# Patient Record
Sex: Male | Born: 1975 | Race: White | Hispanic: No | Smoking: Former smoker
Health system: Southern US, Community
[De-identification: ages and names within clinical notes are randomized; demographics above are authoritative.]

## PROBLEM LIST (undated history)

## (undated) ENCOUNTER — Emergency Department (HOSPITAL_COMMUNITY): Payer: Self-pay | Source: Home / Self Care

## (undated) ENCOUNTER — Ambulatory Visit (HOSPITAL_COMMUNITY): Discharge status: Absent without leave | Payer: BC Managed Care – PPO

## (undated) DIAGNOSIS — I10 Essential (primary) hypertension: Secondary | ICD-10-CM

## (undated) HISTORY — PX: THROAT SURGERY: SHX803

## (undated) HISTORY — DX: Essential (primary) hypertension: I10

## (undated) HISTORY — PX: APPENDECTOMY: SHX54

---

## 1998-03-13 ENCOUNTER — Observation Stay (HOSPITAL_COMMUNITY): Admission: EM | Admit: 1998-03-13 | Discharge: 1998-03-14 | Payer: Self-pay | Admitting: Emergency Medicine

## 2015-03-16 DIAGNOSIS — K4020 Bilateral inguinal hernia, without obstruction or gangrene, not specified as recurrent: Secondary | ICD-10-CM | POA: Insufficient documentation

## 2015-03-16 DIAGNOSIS — Z6835 Body mass index (BMI) 35.0-35.9, adult: Secondary | ICD-10-CM | POA: Insufficient documentation

## 2015-03-16 DIAGNOSIS — R1032 Left lower quadrant pain: Secondary | ICD-10-CM | POA: Insufficient documentation

## 2015-04-13 DIAGNOSIS — Z09 Encounter for follow-up examination after completed treatment for conditions other than malignant neoplasm: Secondary | ICD-10-CM

## 2015-04-13 HISTORY — DX: Encounter for follow-up examination after completed treatment for conditions other than malignant neoplasm: Z09

## 2017-03-05 ENCOUNTER — Encounter (HOSPITAL_COMMUNITY): Payer: Self-pay | Admitting: Emergency Medicine

## 2017-03-05 ENCOUNTER — Emergency Department (HOSPITAL_COMMUNITY)
Admission: EM | Admit: 2017-03-05 | Discharge: 2017-03-05 | Disposition: A | Discharge status: Home / Self Care | Payer: Managed Care, Other (non HMO) | Attending: Physician Assistant | Admitting: Physician Assistant

## 2017-03-05 ENCOUNTER — Emergency Department (HOSPITAL_COMMUNITY): Payer: Managed Care, Other (non HMO)

## 2017-03-05 DIAGNOSIS — Z87891 Personal history of nicotine dependence: Secondary | ICD-10-CM | POA: Diagnosis not present

## 2017-03-05 DIAGNOSIS — Z79899 Other long term (current) drug therapy: Secondary | ICD-10-CM | POA: Insufficient documentation

## 2017-03-05 DIAGNOSIS — R42 Dizziness and giddiness: Secondary | ICD-10-CM

## 2017-03-05 LAB — DIFFERENTIAL
BASOS ABS: 0.1 10*3/uL (ref 0.0–0.1)
Basophils Relative: 1 %
Eosinophils Absolute: 0.1 10*3/uL (ref 0.0–0.7)
Eosinophils Relative: 1 %
LYMPHS ABS: 1.9 10*3/uL (ref 0.7–4.0)
LYMPHS PCT: 24 %
MONOS PCT: 7 %
Monocytes Absolute: 0.6 10*3/uL (ref 0.1–1.0)
NEUTROS PCT: 67 %
Neutro Abs: 5.6 10*3/uL (ref 1.7–7.7)

## 2017-03-05 LAB — APTT: APTT: 31 s (ref 24–36)

## 2017-03-05 LAB — I-STAT CHEM 8, ED
BUN: 7 mg/dL (ref 6–20)
CALCIUM ION: 1.13 mmol/L — AB (ref 1.15–1.40)
CHLORIDE: 103 mmol/L (ref 101–111)
Creatinine, Ser: 0.8 mg/dL (ref 0.61–1.24)
GLUCOSE: 114 mg/dL — AB (ref 65–99)
HCT: 51 % (ref 39.0–52.0)
Hemoglobin: 17.3 g/dL — ABNORMAL HIGH (ref 13.0–17.0)
POTASSIUM: 3.8 mmol/L (ref 3.5–5.1)
Sodium: 139 mmol/L (ref 135–145)
TCO2: 22 mmol/L (ref 22–32)

## 2017-03-05 LAB — COMPREHENSIVE METABOLIC PANEL
ALBUMIN: 4.5 g/dL (ref 3.5–5.0)
ALK PHOS: 72 U/L (ref 38–126)
ALT: 31 U/L (ref 17–63)
AST: 33 U/L (ref 15–41)
Anion gap: 14 (ref 5–15)
BILIRUBIN TOTAL: 1.1 mg/dL (ref 0.3–1.2)
BUN: 5 mg/dL — AB (ref 6–20)
CALCIUM: 9.6 mg/dL (ref 8.9–10.3)
CO2: 20 mmol/L — ABNORMAL LOW (ref 22–32)
CREATININE: 0.87 mg/dL (ref 0.61–1.24)
Chloride: 102 mmol/L (ref 101–111)
GFR calc Af Amer: >60 mL/min (ref >60)
GLUCOSE: 116 mg/dL — AB (ref 65–99)
POTASSIUM: 3.7 mmol/L (ref 3.5–5.1)
Sodium: 136 mmol/L (ref 135–145)
TOTAL PROTEIN: 8.4 g/dL — AB (ref 6.5–8.1)

## 2017-03-05 LAB — PROTIME-INR
INR: 0.98
Prothrombin Time: 12.9 seconds (ref 11.4–15.2)

## 2017-03-05 LAB — CBC
HEMATOCRIT: 49.3 % (ref 39.0–52.0)
Hemoglobin: 17.1 g/dL — ABNORMAL HIGH (ref 13.0–17.0)
MCH: 30.5 pg (ref 26.0–34.0)
MCHC: 34.7 g/dL (ref 30.0–36.0)
MCV: 87.9 fL (ref 78.0–100.0)
Platelets: 267 10*3/uL (ref 150–400)
RBC: 5.61 MIL/uL (ref 4.22–5.81)
RDW: 12.5 % (ref 11.5–15.5)
WBC: 8.2 10*3/uL (ref 4.0–10.5)

## 2017-03-05 LAB — I-STAT TROPONIN, ED: TROPONIN I, POC: 0 ng/mL (ref 0.00–0.08)

## 2017-03-05 LAB — CBG MONITORING, ED: Glucose-Capillary: 106 mg/dL — ABNORMAL HIGH (ref 65–99)

## 2017-03-05 MED ORDER — SODIUM CHLORIDE 0.9 % IV BOLUS (SEPSIS)
1000.0000 mL | Freq: Once | INTRAVENOUS | Status: AC
  Administered 2017-03-05: 1000 mL via INTRAVENOUS

## 2017-03-05 NOTE — ED Notes (Signed)
Checked CBG 106, RN informed 

## 2017-03-05 NOTE — ED Provider Notes (Signed)
[facility]   CSN: [id number] Arrival date & time: 03/05/17  1905     History   Chief Complaint Chief Complaint  Patient presents with  . Dizziness    HPI Ricardo Odom is a 42 y.o. male with no past medical history presenting with sudden onset lightheadedness at 830 last night while he was working.  Patient explains that he was squatting down and standing up quickly multiple times before this happened.  Denies any room spinning.  States that he felt lightheaded but it seemed to last longer than he would expect.  He went and got some rest sat down and improved.  Reports that he thinks he is getting plenty of hydration.  Never had an episode like this before.  It is not exacerbated by rapid head movement.  He reports feeling slightly lightheaded here and there since but not anything as severe as last night.  Denies any headache, visual disturbances, nausea, vomiting, loss of balance, difficulty walking, focal weakness or numbness.  Recent illness, no fever chills, chest pain, shortness of breath. Denies any history of DVT/PE, prolonged immobilization, recent surgery, cough, hemoptysis, unilateral leg swelling or calf pain.  HPI  History reviewed. No pertinent past medical history.  There are no active problems to display for this patient.   Past Surgical History:  Procedure Laterality Date  . APPENDECTOMY    . THROAT SURGERY         Home Medications    Prior to Admission medications   Medication Sig Start Date End Date Taking? Authorizing Provider  Multiple Vitamin (MULTIVITAMIN WITH MINERALS) TABS tablet Take 1 tablet by mouth daily.   Yes [provider]    Family History No family history on file.  Social History Social History   Tobacco Use  . Smoking status: Former Games developermoker  . Smokeless tobacco: Current User    Types: Snuff  Substance Use Topics  . Alcohol use: Yes    Comment: daily  . Drug use:  No     Allergies   Patient has no known allergies.   Review of Systems Review of Systems  Constitutional: Negative for chills, diaphoresis, fatigue and fever.  HENT: Negative for congestion, ear discharge, ear pain, hearing loss, sore throat, tinnitus, trouble swallowing and voice change.   Eyes: Negative for photophobia, pain, redness and visual disturbance.  Respiratory: Negative for cough, choking, chest tightness, shortness of breath, wheezing and stridor.   Cardiovascular: Negative for chest pain, palpitations and leg swelling.  Gastrointestinal: Negative for abdominal distention, abdominal pain, diarrhea, nausea and vomiting.  Genitourinary: Negative for difficulty urinating, dysuria, flank pain, frequency and hematuria.  Musculoskeletal: Negative for arthralgias, back pain, gait problem, joint swelling, myalgias, neck pain and neck stiffness.  Skin: Negative for color change, pallor, rash and wound.  Neurological: Positive for light-headedness. Negative for tremors, seizures, syncope, facial asymmetry, speech difficulty, weakness, numbness and headaches.     Physical Exam Updated Vital Signs BP (!) 165/107 (BP Location: Right Arm)   Pulse 92   Temp 97.9 F (36.6 C) (Oral)   Resp 20   Ht 5\' 6"  (1.676 m)   Wt 99.8 kg (220 lb)   SpO2 97%   BMI 35.51 kg/m   Physical Exam  Constitutional: He is oriented to person, place, and time. He appears well-developed and well-nourished. No distress.  Afebrile, nontoxic-appearing, sitting comfortably in bed no acute distress.  Reporting being asymptomatic.  HENT:  Head: Normocephalic and atraumatic.  Right Ear: External ear normal.  Left Ear: External ear normal.  Mouth/Throat: Oropharynx is clear and moist. No oropharyngeal exudate.  Eyes: Conjunctivae and EOM are normal. Pupils are equal, round, and reactive to light. Right eye exhibits no discharge. Left eye exhibits no discharge.  Neck: Normal range of motion. Neck supple.    Cardiovascular: Normal rate, regular rhythm, normal heart sounds and intact distal pulses.  No murmur heard. Pulmonary/Chest: Effort normal and breath sounds normal. No stridor. No respiratory distress. He has no wheezes. He has no rales.  Abdominal: He exhibits no distension.  Musculoskeletal: Normal range of motion. He exhibits no edema.  Neurological: He is alert and oriented to person, place, and time. No cranial nerve deficit or sensory deficit. He exhibits normal muscle tone. Coordination normal.  Neurologic Exam:  - Mental status: Patient is alert and cooperative. Fluent speech and words are clear. Coherent thought processes and insight is good. Patient is oriented x 4 to person, place, time and event.  - Cranial nerves:  CN III, IV, VI: pupils equally round, reactive to light both direct and conscensual. Full extra-ocular movement. CN V: motor temporalis and masseter strength intact. CN VII : muscles of facial expression intact. CN X :  midline uvula. XI strength of sternocleidomastoid and trapezius muscles 5/5, XII: tongue is midline when protruded. - Motor: No involuntary movements. Muscle tone and bulk normal throughout. Muscle strength is 5/5 in bilateral shoulder abduction, elbow flexion and extension, grip, hip extension, flexion, leg flexion and extension, ankle dorsiflexion and plantar flexion.  - Sensory: Proprioception, light tough sensation intact in all extremities.  - Cerebellar: rapid alternating movements and point to point movement intact in upper and lower extremities. Normal stance and gait.  Negative pronator drift, negative Romberg.    Skin: Skin is warm and dry. No rash noted. He is not diaphoretic. No erythema. No pallor.  Psychiatric: He has a normal mood and affect.  Nursing note and vitals reviewed.    ED Treatments / Results  Labs (all labs ordered are listed, but only abnormal results are displayed) Labs Reviewed  CBC - Abnormal; Notable for the following  components:      Result Value   Hemoglobin 17.1 (*)    All other components within normal limits  COMPREHENSIVE METABOLIC PANEL - Abnormal; Notable for the following components:   CO2 20 (*)    Glucose, Bld 116 (*)    BUN 5 (*)    Total Protein 8.4 (*)    All other components within normal limits  CBG MONITORING, ED - Abnormal; Notable for the following components:   Glucose-Capillary 106 (*)    All other components within normal limits  I-STAT CHEM 8, ED - Abnormal; Notable for the following components:   Glucose, Bld 114 (*)    Calcium, Ion 1.13 (*)    Hemoglobin 17.3 (*)    All other components within normal limits  PROTIME-INR  APTT  DIFFERENTIAL  I-STAT TROPONIN, ED    EKG  EKG Interpretation None       Radiology Ct Head Wo Contrast  Result Date: 03/05/2017 CLINICAL DATA:  Sudden onset of dizziness with nausea. EXAM: CT HEAD WITHOUT CONTRAST TECHNIQUE: Contiguous axial images were obtained from the base of the skull through the vertex without intravenous contrast. COMPARISON:  None. FINDINGS: Brain: No evidence of acute infarction, hemorrhage, hydrocephalus, extra-axial collection or mass lesion/mass effect. Vascular: No hyperdense vessel or unexpected calcification. Skull: Normal. Negative for fracture or focal lesion. Sinuses/Orbits:  Globes and orbits are unremarkable. Visualized sinuses and mastoid air cells are clear. Other: None. IMPRESSION: Normal unenhanced CT scan of the brain. Electronically Signed   By: Amie Portland M.D.   On: 03/05/2017 20:30    Procedures Procedures (including critical care time)  Medications Ordered in ED Medications  sodium chloride 0.9 % bolus 1,000 mL (0 mLs Intravenous Stopped 03/05/17 2228)     Initial Impression / Assessment and Plan / ED Course  I have reviewed the triage vital signs and the nursing notes.  Pertinent labs & imaging results that were available during my care of the patient were reviewed by me and considered in  my medical decision making (see chart for details).    Patient presenting with an episode of lightheadedness associated with rapid positional changes from squatting down to standing up last night.  He does report that it persisted for a few minutes longer than he would have expected but improved after he sat down. No h/a, vis dist, N/V.  He reports mild lightheadedness here and there but nothing as severe since.  Currently asymptomatic. Reassuring exam, normal neuro  Workup negative, negative CT head, labs unremarkable.  Ordered orthostatic vitals and IV fluids and will reassess.  On reassessment, Patient reported improvement and has not had any symptoms while in ED.  Will dc home with close PCP follow up. Discussed bp and advised repeat bp at PCP office.  Discussed strict return precautions and advised to return to the emergency department if experiencing any new or worsening symptoms. Instructions were understood and patient agreed with discharge plan.  Final Clinical Impressions(s) / ED Diagnoses   Final diagnoses:  Lightheadedness    ED Discharge Orders    None       Gregary Cromer 03/05/17 2335    Abelino Derrick, MD 03/09/17 [phone]

## 2017-03-05 NOTE — ED Notes (Signed)
Patient transported to CT 

## 2017-03-05 NOTE — Discharge Instructions (Signed)
As discussed, make sure that you continue stay well-hydrated drinking enough fluids to keep your urine clear.  Take your time when getting out of bed and sit at the edge of the bed before standing up.  Follow-up with your primary care provider in the next week. Return sooner if symptoms worsen, bad headache, vomiting, visual disturbances, dizziness, loss of balance, weakness or other new concerning symptoms in the meantime.

## 2017-03-05 NOTE — ED Triage Notes (Signed)
Reports sudden onset of dizziness at 1830 last night with nausea.  Reports resting a few minutes and it got better but still feels dizzy and is nauseated

## 2017-03-22 ENCOUNTER — Telehealth: Payer: Self-pay | Admitting: *Deleted

## 2017-03-22 NOTE — Telephone Encounter (Signed)
REFERRAL SENT TO SCHEDULING FROM [facility], DR. HOLT, [phone]

## 2017-05-05 ENCOUNTER — Encounter: Payer: Self-pay | Admitting: Internal Medicine

## 2017-05-11 ENCOUNTER — Encounter: Payer: Self-pay | Admitting: Internal Medicine

## 2017-05-11 ENCOUNTER — Ambulatory Visit: Payer: Managed Care, Other (non HMO) | Admitting: Internal Medicine

## 2017-05-11 VITALS — BP 150/100 | HR 88 | Ht 66.0 in | Wt 196.4 lb

## 2017-05-11 DIAGNOSIS — R42 Dizziness and giddiness: Secondary | ICD-10-CM

## 2017-05-11 DIAGNOSIS — R002 Palpitations: Secondary | ICD-10-CM

## 2017-05-11 DIAGNOSIS — I493 Ventricular premature depolarization: Secondary | ICD-10-CM

## 2017-05-11 DIAGNOSIS — I10 Essential (primary) hypertension: Secondary | ICD-10-CM | POA: Diagnosis not present

## 2017-05-11 LAB — LIPID PANEL
CHOLESTEROL TOTAL: 210 mg/dL — AB (ref 100–199)
Chol/HDL Ratio: 3.9 ratio (ref 0.0–5.0)
HDL: 54 mg/dL (ref >39)
LDL CALC: 130 mg/dL — AB (ref 0–99)
Triglycerides: 128 mg/dL (ref 0–149)
VLDL Cholesterol Cal: 26 mg/dL (ref 5–40)

## 2017-05-11 LAB — BASIC METABOLIC PANEL
BUN/Creatinine Ratio: 10 (ref 9–20)
BUN: 9 mg/dL (ref 6–24)
CALCIUM: 10.3 mg/dL — AB (ref 8.7–10.2)
CHLORIDE: 99 mmol/L (ref 96–106)
CO2: 23 mmol/L (ref 20–29)
Creatinine, Ser: 0.86 mg/dL (ref 0.76–1.27)
GFR calc non Af Amer: 107 mL/min/{1.73_m2} (ref >59)
GFR, EST AFRICAN AMERICAN: 124 mL/min/{1.73_m2} (ref >59)
Glucose: 93 mg/dL (ref 65–99)
POTASSIUM: 4.5 mmol/L (ref 3.5–5.2)
Sodium: 139 mmol/L (ref 134–144)

## 2017-05-11 LAB — TSH: TSH: 0.992 u[IU]/mL (ref 0.450–4.500)

## 2017-05-11 LAB — MAGNESIUM: Magnesium: 2.2 mg/dL (ref 1.6–2.3)

## 2017-05-11 NOTE — Patient Instructions (Addendum)
Medication Instructions:  Your physician recommends that you continue on your current medications as directed. Please refer to the Current Medication list given to you today.  -- If you need a refill on your cardiac medications before your next appointment, please call your pharmacy. --  Labwork: BPM/MAGNESIUM/TSH/LIPID PROFILE  TODAY   Testing/Procedures:ASAP  Your physician has requested that you have an echocardiogram. Echocardiography is a painless test that uses sound waves to create images of your heart. It provides your doctor with information about the size and shape of your heart and how well your heart's chambers and valves are working. This procedure takes approximately one hour. There are no restrictions for this procedure.   Follow-Up: Your physician wants you to follow-up in: 4-6 WEEKS with Dr. Okey DupreEnd or APP   Thank you for choosing [facility]!!    Any Other Special Instructions Will Be Listed Below (If Applicable).    DASH Eating Plan DASH stands for "Dietary Approaches to Stop Hypertension." The DASH eating plan is a healthy eating plan that has been shown to reduce high blood pressure (hypertension). It may also reduce your risk for type 2 diabetes, heart disease, and stroke. The DASH eating plan may also help with weight loss. What are tips for following this plan? General guidelines  Avoid eating more than 2,300 mg (milligrams) of salt (sodium) a day. If you have hypertension, you may need to reduce your sodium intake to 1,500 mg a day.  Limit alcohol intake to no more than 1 drink a day for nonpregnant women and 2 drinks a day for men. One drink equals 12 oz of beer, 5 oz of wine, or 1 oz of hard liquor.  Work with your health care provider to maintain a healthy body weight or to lose weight. Ask what an ideal weight is for you.  Get at least 30 minutes of exercise that causes your heart to beat faster (aerobic exercise) most days of the week. Activities may  include walking, swimming, or biking.  Work with your health care provider or diet and nutrition specialist (dietitian) to adjust your eating plan to your individual calorie needs. Reading food labels  Check food labels for the amount of sodium per serving. Choose foods with less than 5 percent of the Daily Value of sodium. Generally, foods with less than 300 mg of sodium per serving fit into this eating plan.  To find whole grains, look for the word "whole" as the first word in the ingredient list. Shopping  Buy products labeled as "low-sodium" or "no salt added."  Buy fresh foods. Avoid canned foods and premade or frozen meals. Cooking  Avoid adding salt when cooking. Use salt-free seasonings or herbs instead of table salt or sea salt. Check with your health care provider or pharmacist before using salt substitutes.  Do not fry foods. Cook foods using healthy methods such as baking, boiling, grilling, and broiling instead.  Cook with heart-healthy oils, such as olive, canola, soybean, or sunflower oil. Meal planning   Eat a balanced diet that includes: ? 5 or more servings of fruits and vegetables each day. At each meal, try to fill half of your plate with fruits and vegetables. ? Up to 6-8 servings of whole grains each day. ? Less than 6 oz of lean meat, poultry, or fish each day. A 3-oz serving of meat is about the same size as a deck of cards. One egg equals 1 oz. ? 2 servings of low-fat dairy each day. ?  A serving of nuts, seeds, or beans 5 times each week. ? Heart-healthy fats. Healthy fats called Omega-3 fatty acids are found in foods such as flaxseeds and coldwater fish, like sardines, salmon, and mackerel.  Limit how much you eat of the following: ? Canned or prepackaged foods. ? Food that is high in trans fat, such as fried foods. ? Food that is high in saturated fat, such as fatty meat. ? Sweets, desserts, sugary drinks, and other foods with added sugar. ? Full-fat  dairy products.  Do not salt foods before eating.  Try to eat at least 2 vegetarian meals each week.  Eat more home-cooked food and less restaurant, buffet, and fast food.  When eating at a restaurant, ask that your food be prepared with less salt or no salt, if possible. What foods are recommended? The items listed may not be a complete list. Talk with your dietitian about what dietary choices are best for you. Grains Whole-grain or whole-wheat bread. Whole-grain or whole-wheat pasta. Brown rice. Orpah Cobb. Bulgur. Whole-grain and low-sodium cereals. Pita bread. Low-fat, low-sodium crackers. Whole-wheat flour tortillas. Vegetables Fresh or frozen vegetables (raw, steamed, roasted, or grilled). Low-sodium or reduced-sodium tomato and vegetable juice. Low-sodium or reduced-sodium tomato sauce and tomato paste. Low-sodium or reduced-sodium canned vegetables. Fruits All fresh, dried, or frozen fruit. Canned fruit in natural juice (without added sugar). Meat and other protein foods Skinless chicken or [place]. Ground chicken or [place]. Pork with fat trimmed off. Fish and seafood. Egg whites. Dried beans, peas, or lentils. Unsalted nuts, nut butters, and seeds. Unsalted canned beans. Lean cuts of beef with fat trimmed off. Low-sodium, lean deli meat. Dairy Low-fat (1%) or fat-free (skim) milk. Fat-free, low-fat, or reduced-fat cheeses. Nonfat, low-sodium ricotta or cottage cheese. Low-fat or nonfat yogurt. Low-fat, low-sodium cheese. Fats and oils Soft margarine without trans fats. Vegetable oil. Low-fat, reduced-fat, or light mayonnaise and salad dressings (reduced-sodium). Canola, safflower, olive, soybean, and sunflower oils. Avocado. Seasoning and other foods Herbs. Spices. Seasoning mixes without salt. Unsalted popcorn and pretzels. Fat-free sweets. What foods are not recommended? The items listed may not be a complete list. Talk with your dietitian about what dietary choices are best  for you. Grains Baked goods made with fat, such as croissants, muffins, or some breads. Dry pasta or rice meal packs. Vegetables Creamed or fried vegetables. Vegetables in a cheese sauce. Regular canned vegetables (not low-sodium or reduced-sodium). Regular canned tomato sauce and paste (not low-sodium or reduced-sodium). Regular tomato and vegetable juice (not low-sodium or reduced-sodium). Rosita Fire. Olives. Fruits Canned fruit in a light or heavy syrup. Fried fruit. Fruit in cream or butter sauce. Meat and other protein foods Fatty cuts of meat. Ribs. Fried meat. Tomasa Blase. Sausage. Bologna and other processed lunch meats. Salami. Fatback. Hotdogs. Bratwurst. Salted nuts and seeds. Canned beans with added salt. Canned or smoked fish. Whole eggs or egg yolks. Chicken or [place] with skin. Dairy Whole or 2% milk, cream, and half-and-half. Whole or full-fat cream cheese. Whole-fat or sweetened yogurt. Full-fat cheese. Nondairy creamers. Whipped toppings. Processed cheese and cheese spreads. Fats and oils Butter. Stick margarine. Lard. Shortening. Ghee. Bacon fat. Tropical oils, such as coconut, palm kernel, or palm oil. Seasoning and other foods Salted popcorn and pretzels. Onion salt, garlic salt, seasoned salt, table salt, and sea salt. Worcestershire sauce. Tartar sauce. Barbecue sauce. Teriyaki sauce. Soy sauce, including reduced-sodium. Steak sauce. Canned and packaged gravies. Fish sauce. Oyster sauce. Cocktail sauce. Horseradish that you find on the shelf. Ketchup.  Mustard. Meat flavorings and tenderizers. Bouillon cubes. Hot sauce and Tabasco sauce. Premade or packaged marinades. Premade or packaged taco seasonings. Relishes. Regular salad dressings. Where to find more information:  National Heart, Lung, and Blood Institute: PopSteam.is  American Heart Association: www.heart.org Summary  The DASH eating plan is a healthy eating plan that has been shown to reduce high blood pressure  (hypertension). It may also reduce your risk for type 2 diabetes, heart disease, and stroke.  With the DASH eating plan, you should limit salt (sodium) intake to 2,300 mg a day. If you have hypertension, you may need to reduce your sodium intake to 1,500 mg a day.  When on the DASH eating plan, aim to eat more fresh fruits and vegetables, whole grains, lean proteins, low-fat dairy, and heart-healthy fats.  Work with your health care provider or diet and nutrition specialist (dietitian) to adjust your eating plan to your individual calorie needs. This information is not intended to replace advice given to you by your health care provider. Make sure you discuss any questions you have with your health care provider. Document Released: 12/30/2010 Document Revised: 01/04/2016 Document Reviewed: 01/04/2016 Elsevier Interactive Patient Education  [employer].

## 2017-05-11 NOTE — Progress Notes (Signed)
New Outpatient Visit Date: 05/11/2017  Referring Provider: Marylen Ponto, MD [address] [place] [address],  Chief Complaint: Palpitations  HPI:  Ricardo Odom is a 42 y.o. male who is being seen today for the evaluation of palpitations at the request of Dr. Leonor Liv. He has no significant past medical history.  He presented to the [facility] emergency department in February with lightheadedness.  Workup was unrevealing with diagnosis of presumed orthostatic lightheadedness.  Today, Ricardo Odom reports feeling well.  He reports that 5-6 years ago, he had episodes during which it felt as though his heart was "pausing."  He saw Dr. Bing Matter in [place] and underwent a stress test and Holter monitor.  It sounds like PVCs may have been noted but otherwise no significant abnormalities.  Up until 2 months ago, Ricardo Odom had felt well.  He was at work and stood up with the sudden onset of lightheadedness that lasted about 30 seconds.  He continued to feel generally weak prompting him to go to the emergency department as noted above.  He denies chest pain, shortness of breath, edema, orthopnea, PND, and claudication.  Ricardo Odom subsequently followed with his PCP, at which time his blood pressure was found to be elevated.  Antihypertensive therapy was discussed, but Ricardo Odom wish to pursue lifestyle modifications first.  He has lost about 20 pounds over the last month and overall feels more energized.  He is also trying to limit his salt intake.  --------------------------------------------------------------------------------------------------  Cardiovascular History & Procedures: Cardiovascular Problems:  Palpitations  Lightheadedness  Risk Factors:  Hypertension and male gender  Cath/PCI:  None  CV Surgery:  None  EP Procedures and Devices:  None available  Non-Invasive Evaluation(s):  None available  Recent CV Pertinent Labs: Lab Results  Component Value Date   CHOL 210  (H) 05/11/2017   HDL 54 05/11/2017   LDLCALC 130 (H) 05/11/2017   TRIG 128 05/11/2017   CHOLHDL 3.9 05/11/2017   INR 0.98 03/05/2017   K 4.5 05/11/2017   MG 2.2 05/11/2017   BUN 9 05/11/2017   CREATININE 0.86 05/11/2017    --------------------------------------------------------------------------------------------------  Past Medical History:  Diagnosis Date  . Hypertension    Past Surgical History:  Procedure Laterality Date  . APPENDECTOMY    . THROAT SURGERY      Current Meds  Medication Sig  . Multiple Vitamin (MULTIVITAMIN WITH MINERALS) TABS tablet Take 1 tablet by mouth daily.    Allergies: Patient has no known allergies.  Social History   Socioeconomic History  . Marital status: Divorced    Spouse name: Not on file  . Number of children: Not on file  . Years of education: Not on file  . Highest education level: Not on file  Occupational History  . Not on file  Social Needs  . Financial resource strain: Not on file  . Food insecurity:    Worry: Not on file    Inability: Not on file  . Transportation needs:    Medical: Not on file    Non-medical: Not on file  Tobacco Use  . Smoking status: Former Smoker    Packs/day: 10.00    Years: 1.00    Pack years: 10.00    Types: Cigarettes    Last attempt to quit: 07/28/2002    Years since quitting: 14.8  . Smokeless tobacco: Current User    Types: Snuff  Substance and Sexual Activity  . Alcohol use: Yes    Alcohol/week: 15.0  oz    Types: 25 Cans of beer per week    Comment: daily  . Drug use: No  . Sexual activity: Not on file  Lifestyle  . Physical activity:    Days per week: Not on file    Minutes per session: Not on file  . Stress: Not on file  Relationships  . Social connections:    Talks on phone: Not on file    Gets together: Not on file    Attends religious service: Not on file    Active member of club or organization: Not on file    Attends meetings of clubs or organizations: Not on file      Relationship status: Not on file  . Intimate partner violence:    Fear of current or ex partner: Not on file    Emotionally abused: Not on file    Physically abused: Not on file    Forced sexual activity: Not on file  Other Topics Concern  . Not on file  Social History Narrative  . Not on file    Family History  Problem Relation Age of Onset  . Healthy Mother   . Other Father        pacemaker  . Bradycardia Father   . Healthy Sister   . Heart disease Paternal Grandfather     Review of Systems: A 12-system review of systems was performed and was negative except as noted in the HPI.  --------------------------------------------------------------------------------------------------  Physical Exam: BP (!) 150/100   Pulse 88   Ht  (1.676 m)   Wt 196 lb 6.4 oz (89.1 kg)   BMI 31.70 kg/m   General:  NAD. HEENT: No conjunctival pallor or scleral icterus. Moist mucous membranes. OP clear. Neck: Supple without lymphadenopathy, thyromegaly, JVD, or HJR. No carotid bruit. Lungs: Normal work of breathing. Clear to auscultation bilaterally without wheezes or crackles. Heart: Regular rate and rhythm without murmurs, rubs, or gallops. Non-displaced PMI. Abd: Bowel sounds present. Soft, NT/ND without hepatosplenomegaly Ext: No lower extremity edema. Radial, PT, and DP pulses are 2+ bilaterally Skin: Warm and dry without rash. Neuro: CNIII-XII intact. Strength and fine-touch sensation intact in upper and lower extremities bilaterally. Psych: Normal mood and affect.  EKG: Sinus bradycardia with isolated PVC and borderline LVH.  Lab Results  Component Value Date   WBC 8.2 03/05/2017   HGB 17.3 (H) 03/05/2017   HCT 51.0 03/05/2017   MCV 87.9 03/05/2017   PLT 267 03/05/2017    Lab Results  Component Value Date   NA 139 05/11/2017   K 4.5 05/11/2017   CL 99 05/11/2017   CO2 23 05/11/2017   BUN 9 05/11/2017   CREATININE 0.86 05/11/2017   GLUCOSE 93 05/11/2017   ALT  31 03/05/2017    Lab Results  Component Value Date   CHOL 210 (H) 05/11/2017   HDL 54 05/11/2017   LDLCALC 130 (H) 05/11/2017   TRIG 128 05/11/2017   CHOLHDL 3.9 05/11/2017     --------------------------------------------------------------------------------------------------  ASSESSMENT AND PLAN: Lightheadedness, palpitations, and PVC's Single episode occurred ~2 months ago followed by several weeks of generalized unwell feeling.  Ricardo Odom reports feeling back to baseline today.  Exam is unrevealing.  EKG shows borderline LVH with isolated PVC.  We have agreed to obtain a transthoracic echocardiogram for further assessment.  I will check a BMP, magnesium level, and TSH today.  We will also refer Ricardo Odom for an echocardiogram.  Given only a single episode has  occurred in the last 5-6 years, we have agreed to defer ambulatory cardiac monitoring.  I will check a lipid panel for risk stratification.  Hypertension BP elevated today.  Ricardo Odom would like to avoid medications, if possible.  I have encouraged him to minimize his salt intake and to continue to exercise and diet.  Follow-up: Return to clinic in 4-6 weeks.  Yvonne Kendall, MD 05/12/2017 1:42 PM

## 2017-05-12 ENCOUNTER — Encounter: Payer: Self-pay | Admitting: Internal Medicine

## 2017-05-12 DIAGNOSIS — I493 Ventricular premature depolarization: Secondary | ICD-10-CM | POA: Insufficient documentation

## 2017-05-12 DIAGNOSIS — R002 Palpitations: Secondary | ICD-10-CM

## 2017-05-12 DIAGNOSIS — R42 Dizziness and giddiness: Secondary | ICD-10-CM

## 2017-05-12 DIAGNOSIS — I10 Essential (primary) hypertension: Secondary | ICD-10-CM | POA: Insufficient documentation

## 2017-05-12 HISTORY — DX: Dizziness and giddiness: R42

## 2017-05-12 HISTORY — DX: Ventricular premature depolarization: I49.3

## 2017-05-12 HISTORY — DX: Essential (primary) hypertension: I10

## 2017-05-12 HISTORY — DX: Palpitations: R00.2

## 2017-05-16 ENCOUNTER — Ambulatory Visit (HOSPITAL_COMMUNITY): Payer: Managed Care, Other (non HMO)

## 2017-05-25 ENCOUNTER — Ambulatory Visit (HOSPITAL_COMMUNITY): Payer: Managed Care, Other (non HMO) | Attending: Cardiology

## 2017-05-25 ENCOUNTER — Other Ambulatory Visit: Payer: Self-pay

## 2017-05-25 DIAGNOSIS — R42 Dizziness and giddiness: Secondary | ICD-10-CM | POA: Insufficient documentation

## 2017-05-25 DIAGNOSIS — R002 Palpitations: Secondary | ICD-10-CM | POA: Diagnosis not present

## 2017-05-25 DIAGNOSIS — I10 Essential (primary) hypertension: Secondary | ICD-10-CM | POA: Insufficient documentation

## 2017-05-25 DIAGNOSIS — I493 Ventricular premature depolarization: Secondary | ICD-10-CM | POA: Diagnosis not present

## 2017-06-11 DIAGNOSIS — I77810 Thoracic aortic ectasia: Secondary | ICD-10-CM | POA: Insufficient documentation

## 2017-06-11 DIAGNOSIS — E785 Hyperlipidemia, unspecified: Secondary | ICD-10-CM | POA: Insufficient documentation

## 2017-06-11 HISTORY — DX: Thoracic aortic ectasia: I77.810

## 2017-06-11 HISTORY — DX: Hyperlipidemia, unspecified: E78.5

## 2017-06-11 NOTE — Progress Notes (Signed)
Cardiology Office Note:    Date:  06/12/2017   ID:  Ricardo Odom, DOB 12/19/1975, MRN [id number]  PCP:  Marylen Ponto, MD  Cardiologist:  Yvonne Kendall, MD  Referring MD: Marylen Ponto, MD   Chief Complaint  Patient presents with  . Follow-up    echocardiogram, hypertension    History of Present Illness:    Ricardo Odom is a 42 y.o. male with a past medical history significant for hypertension. He was seen for initial evaluation of lightheadedness on 05/11/17 by Dr. Okey Dupre. He had presented to [facility] ED in February with lightheadedness that occurred at work upon standing. Workup in the ED only pointed to orthostatic lightheadedness.   [name] was feeling well at his appointment in April. Upon review of his prior health he had noted some pausing of his heart beat about 5-6 years ago. This was evaluated by Dr. Bing Matter with a stress test and holter monitor. Dr. Okey Dupre felt this was likely to have been PVCs. Since the patient has not noted palpitations except for that singe episode, Outpatient monitor was deferred. Labs were essentially normal, very slightly elevated calcium.  An echocardiogram was done and revealed normal LV systolic function with EF 55-60%, grade 1 DD, mildly dilated aortic root and mild aortic regurgitation.   Pt is here today for follow up with his wife. He has had no further lightheadedness. 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    Since the episode in February he has changed his diet, he gave up sodas, most sugar, salt, bread and pasta. He is drinking smoothies, fruits and vegetables, kale. He has lost 20 lbs in last 3 months. He quit smoking on 07/28/2002. He is having nop chest discomfort or shortness of breath. He has just worked 3rd shift and attributes his elevated BP in our office to being tired. I rechecked his BP after rest and it is still elevated. He  reports that home BP's have been 120's-130's/70's-80's. At 0630 this morning at home his BP was 124/84.    Past Medical History:  Diagnosis Date  . Hypertension     Past Surgical History:  Procedure Laterality Date  . APPENDECTOMY    . THROAT SURGERY      Current Medications: Current Meds  Medication Sig  . Multiple Vitamin (MULTIVITAMIN WITH MINERALS) TABS tablet Take 1 tablet by mouth daily.     Allergies:   Patient has no known allergies.   Social History   Socioeconomic History  . Marital status: Divorced    Spouse name: Not on file  . Number of children: Not on file  . Years of education: Not on file  . Highest education level: Not on file  Occupational History  . Not on file  Social Needs  . Financial resource strain: Not on file  . Food insecurity:    Worry: Not on file    Inability: Not on file  . Transportation needs:    Medical: Not on file    Non-medical: Not on file  Tobacco Use  . Smoking status: Former Smoker    Packs/day: 10.00    Years: 1.00    Pack years: 10.00    Types: Cigarettes    Last attempt to quit: 07/28/2002    Years since quitting: 14.8  . Smokeless tobacco: Current User    Types: Snuff  Substance and Sexual  Activity  . Alcohol use: Yes    Alcohol/week: 15.0 oz    Types: 25 Cans of beer per week    Comment: daily  . Drug use: No  . Sexual activity: Not on file  Lifestyle  . Physical activity:    Days per week: Not on file    Minutes per session: Not on file  . Stress: Not on file  Relationships  . Social connections:    Talks on phone: Not on file    Gets together: Not on file    Attends religious service: Not on file    Active member of club or organization: Not on file    Attends meetings of clubs or organizations: Not on file    Relationship status: Not on file  Other Topics Concern  . Not on file  Social History Narrative  . Not on file     Family History: The patient's family history includes Bradycardia in his  father; Healthy in his mother and sister; Heart disease in his paternal grandfather; Other in his father. ROS:   Please see the history of present illness.     All other systems reviewed and are negative.  EKGs/Labs/Other Studies Reviewed:    The following studies were reviewed today:  Echocardiogram 05/25/2017 Study Conclusions - Left ventricle: The cavity size was normal. Wall thickness was   normal. Systolic function was normal. The estimated ejection   fraction was in the range of 55% to 60%. Wall motion was normal;   there were no regional wall motion abnormalities. Doppler   parameters are consistent with abnormal left ventricular   relaxation (grade 1 diastolic dysfunction). - Aortic valve: There was mild regurgitation. - Aortic root: The aortic root was mildly dilated.  Impressions: - Normal LV systolic function; mild diastolic dysfunction;   sclerotic aortic valve with mild AI; mildly dilated aortic root   (4.3 cm).   EKG:  EKG is not ordered today.   Recent Labs: 03/05/2017: ALT 31; Hemoglobin 17.3; Platelets 267 05/11/2017: BUN 9; Creatinine, Ser 0.86; Magnesium 2.2; Potassium 4.5; Sodium 139; TSH 0.992   Recent Lipid Panel    Component Value Date/Time   CHOL 210 (H) 05/11/2017 0935   TRIG 128 05/11/2017 0935   HDL 54 05/11/2017 0935   CHOLHDL 3.9 05/11/2017 0935   LDLCALC 130 (H) 05/11/2017 0935    Physical Exam:    VS:  BP (!) 144/92   Pulse 77   Ht  (1.676 m)   Wt 189 lb (85.7 kg)   BMI 30.51 kg/m     Wt Readings from Last 3 Encounters:  06/12/17 189 lb (85.7 kg)  05/11/17 196 lb 6.4 oz (89.1 kg)  03/05/17 220 lb (99.8 kg)     Physical Exam  Constitutional: He is oriented to person, place, and time. He appears well-developed and well-nourished.  HENT:  Head: Normocephalic and atraumatic.  Neck: Normal range of motion. Neck supple. No JVD present.  Cardiovascular: Normal rate, regular rhythm, normal heart sounds and intact distal pulses.  Exam reveals no gallop and no friction rub.  No murmur heard. Pulmonary/Chest: Effort normal and breath sounds normal. No respiratory distress.  Abdominal: Soft. Bowel sounds are normal.  Musculoskeletal: Normal range of motion. He exhibits no edema or deformity.  Neurological: He is alert and oriented to person, place, and time.  Skin: Skin is warm and dry.  Psychiatric: He has a normal mood and affect. His behavior is normal. Thought content normal.  Vitals  reviewed.   ASSESSMENT:    1. Lightheadedness   2. Aortic root dilatation (HCC)   3. Essential hypertension   4. Mixed hyperlipidemia    PLAN:    In order of problems listed above:   Lightheadedness, palpitations, and PVCs: Lightheadedness in February, hx of palpitations 5-6 years ago. Labs unremarkable for cause of lightheadedness. Echo with normal LV systolic function. No further lightheadedness since February and no palpitations.   Aortic dilatation: 4.3 cm with mild aortic regurgitation. Trileaflet aortic valve. Pt denies known family history of sudden unexplained death, aortic aneurysm or aortic dissection. Advise CVD risk reduction, no smoking, BP control, cholesterol management. Will obtain CT to better evaluate aortic root. Pt wants to "shop around" for the best price. Plan to follow up in 6 months and If stable, monitor annually.   Hypertension: In setting of mild aortic dilatation, recommend BP control with SBP 105-120 mmHg. BP 144/92. Pt reports home BP's 120's-130's/70's-80's. Pt is reluctant to start medications, but agrees to low dose ARB. Will start losartan 50 mg. Pt advised on BP goal and to monitor BP at home. He will call if consistently >130/80 at which time would increase losartan to 100 mg. He has an appointment with his PCP in 3-4 weeks and will have BP checked at that time.   Hyperlipidemia: LDL 130. There has been some evidence of control of the growth of aortic aneurysms with the use of statin. Discussed  lipid management with patient. He has changed his eating habits and has lost over 20 lbs so far. He wants to forgo initiating a statin at present and continue to work on lifestyle modifications.    Medication Adjustments/Labs and Tests Ordered: Current medicines are reviewed at length with the patient today.  Concerns regarding medicines are outlined above. Labs and tests ordered and medication changes are outlined in the patient instructions below:  Patient Instructions  Medication Instructions:  1. START LOSARTAN 50 MG DAILY; RX HAS BEEN SENT IN   Labwork: NONE ORDERED TODAY  Testing/Procedures: YOU WILL NEED TO SCHEDULE A CHEST CT-A, CALL OUR OFFICE WHEN YOU ARE READY TO SCHEDULE TESTING [phone]  Follow-Up: Your physician wants you to follow-up in: 6 MONTHS WITH DR. END  You will receive a reminder letter in the mail two months in advance. If you don't receive a letter, please call our office to schedule the follow-up appointment.   Any Other Special Instructions Will Be Listed Below (If Applicable).  MONITOR BLOOD PRESSURE AND CALL THE OFFICE IS CONSISTENTLY RUNNING 130/80 OR HIGHER, IF RUNNING HIGHER WE MAY NEED TO INCREASE LOSARTAN; PER NP SHE WOULD LIKE TO SEE SYSTOLIC BLOOD PRESSURE (TOP NUMBER) BETWEEN 105-120  If you need a refill on your cardiac medications before your next appointment, please call your pharmacy.   DASH Eating Plan DASH stands for "Dietary Approaches to Stop Hypertension." The DASH eating plan is a healthy eating plan that has been shown to reduce high blood pressure (hypertension). It may also reduce your risk for type 2 diabetes, heart disease, and stroke. The DASH eating plan may also help with weight loss. What are tips for following this plan? General guidelines  Avoid eating more than 2,300 mg (milligrams) of salt (sodium) a day. If you have hypertension, you may need to reduce your sodium intake to 1,500 mg a day.  Limit alcohol intake to no  more than 1 drink a day for nonpregnant women and 2 drinks a day for men. One drink equals 12 oz  of beer, 5 oz of wine, or 1 oz of hard liquor.  Work with your health care provider to maintain a healthy body weight or to lose weight. Ask what an ideal weight is for you.  Get at least 30 minutes of exercise that causes your heart to beat faster (aerobic exercise) most days of the week. Activities may include walking, swimming, or biking.  Work with your health care provider or diet and nutrition specialist (dietitian) to adjust your eating plan to your individual calorie needs. Reading food labels  Check food labels for the amount of sodium per serving. Choose foods with less than 5 percent of the Daily Value of sodium. Generally, foods with less than 300 mg of sodium per serving fit into this eating plan.  To find whole grains, look for the word "whole" as the first word in the ingredient list. Shopping  Buy products labeled as "low-sodium" or "no salt added."  Buy fresh foods. Avoid canned foods and premade or frozen meals. Cooking  Avoid adding salt when cooking. Use salt-free seasonings or herbs instead of table salt or sea salt. Check with your health care provider or pharmacist before using salt substitutes.  Do not fry foods. Cook foods using healthy methods such as baking, boiling, grilling, and broiling instead.  Cook with heart-healthy oils, such as olive, canola, soybean, or sunflower oil. Meal planning   Eat a balanced diet that includes: ? 5 or more servings of fruits and vegetables each day. At each meal, try to fill half of your plate with fruits and vegetables. ? Up to 6-8 servings of whole grains each day. ? Less than 6 oz of lean meat, poultry, or fish each day. A 3-oz serving of meat is about the same size as a deck of cards. One egg equals 1 oz. ? 2 servings of low-fat dairy each day. ? A serving of nuts, seeds, or beans 5 times each week. ? Heart-healthy fats.  Healthy fats called Omega-3 fatty acids are found in foods such as flaxseeds and coldwater fish, like sardines, salmon, and mackerel.  Limit how much you eat of the following: ? Canned or prepackaged foods. ? Food that is high in trans fat, such as fried foods. ? Food that is high in saturated fat, such as fatty meat. ? Sweets, desserts, sugary drinks, and other foods with added sugar. ? Full-fat dairy products.  Do not salt foods before eating.  Try to eat at least 2 vegetarian meals each week.  Eat more home-cooked food and less restaurant, buffet, and fast food.  When eating at a restaurant, ask that your food be prepared with less salt or no salt, if possible. What foods are recommended? The items listed may not be a complete list. Talk with your dietitian about what dietary choices are best for you. Grains Whole-grain or whole-wheat bread. Whole-grain or whole-wheat pasta. Brown rice. Orpah Cobb. Bulgur. Whole-grain and low-sodium cereals. Pita bread. Low-fat, low-sodium crackers. Whole-wheat flour tortillas. Vegetables Fresh or frozen vegetables (raw, steamed, roasted, or grilled). Low-sodium or reduced-sodium tomato and vegetable juice. Low-sodium or reduced-sodium tomato sauce and tomato paste. Low-sodium or reduced-sodium canned vegetables. Fruits All fresh, dried, or frozen fruit. Canned fruit in natural juice (without added sugar). Meat and other protein foods Skinless chicken or [place]. Ground chicken or [place]. Pork with fat trimmed off. Fish and seafood. Egg whites. Dried beans, peas, or lentils. Unsalted nuts, nut butters, and seeds. Unsalted canned beans. Lean cuts of beef with fat  trimmed off. Low-sodium, lean deli meat. Dairy Low-fat (1%) or fat-free (skim) milk. Fat-free, low-fat, or reduced-fat cheeses. Nonfat, low-sodium ricotta or cottage cheese. Low-fat or nonfat yogurt. Low-fat, low-sodium cheese. Fats and oils Soft margarine without trans fats. Vegetable  oil. Low-fat, reduced-fat, or light mayonnaise and salad dressings (reduced-sodium). Canola, safflower, olive, soybean, and sunflower oils. Avocado. Seasoning and other foods Herbs. Spices. Seasoning mixes without salt. Unsalted popcorn and pretzels. Fat-free sweets. What foods are not recommended? The items listed may not be a complete list. Talk with your dietitian about what dietary choices are best for you. Grains Baked goods made with fat, such as croissants, muffins, or some breads. Dry pasta or rice meal packs. Vegetables Creamed or fried vegetables. Vegetables in a cheese sauce. Regular canned vegetables (not low-sodium or reduced-sodium). Regular canned tomato sauce and paste (not low-sodium or reduced-sodium). Regular tomato and vegetable juice (not low-sodium or reduced-sodium). Rosita Fire. Olives. Fruits Canned fruit in a light or heavy syrup. Fried fruit. Fruit in cream or butter sauce. Meat and other protein foods Fatty cuts of meat. Ribs. Fried meat. Tomasa Blase. Sausage. Bologna and other processed lunch meats. Salami. Fatback. Hotdogs. Bratwurst. Salted nuts and seeds. Canned beans with added salt. Canned or smoked fish. Whole eggs or egg yolks. Chicken or [place] with skin. Dairy Whole or 2% milk, cream, and half-and-half. Whole or full-fat cream cheese. Whole-fat or sweetened yogurt. Full-fat cheese. Nondairy creamers. Whipped toppings. Processed cheese and cheese spreads. Fats and oils Butter. Stick margarine. Lard. Shortening. Ghee. Bacon fat. Tropical oils, such as coconut, palm kernel, or palm oil. Seasoning and other foods Salted popcorn and pretzels. Onion salt, garlic salt, seasoned salt, table salt, and sea salt. Worcestershire sauce. Tartar sauce. Barbecue sauce. Teriyaki sauce. Soy sauce, including reduced-sodium. Steak sauce. Canned and packaged gravies. Fish sauce. Oyster sauce. Cocktail sauce. Horseradish that you find on the shelf. Ketchup. Mustard. Meat flavorings and  tenderizers. Bouillon cubes. Hot sauce and Tabasco sauce. Premade or packaged marinades. Premade or packaged taco seasonings. Relishes. Regular salad dressings. Where to find more information:  National Heart, Lung, and Blood Institute: PopSteam.is  American Heart Association: www.heart.org Summary  The DASH eating plan is a healthy eating plan that has been shown to reduce high blood pressure (hypertension). It may also reduce your risk for type 2 diabetes, heart disease, and stroke.  With the DASH eating plan, you should limit salt (sodium) intake to 2,300 mg a day. If you have hypertension, you may need to reduce your sodium intake to 1,500 mg a day.  When on the DASH eating plan, aim to eat more fresh fruits and vegetables, whole grains, lean proteins, low-fat dairy, and heart-healthy fats.  Work with your health care provider or diet and nutrition specialist (dietitian) to adjust your eating plan to your individual calorie needs. This information is not intended to replace advice given to you by your health care provider. Make sure you discuss any questions you have with your health care provider. Document Released: 12/30/2010 Document Revised: 01/04/2016 Document Reviewed: 01/04/2016 Elsevier Interactive Patient Education  [employer].     Signed, Berton Bon, NP  06/12/2017 8:49 AM    [facility]

## 2017-06-12 ENCOUNTER — Encounter (INDEPENDENT_AMBULATORY_CARE_PROVIDER_SITE_OTHER): Payer: Self-pay

## 2017-06-12 ENCOUNTER — Encounter: Payer: Self-pay | Admitting: Cardiology

## 2017-06-12 ENCOUNTER — Ambulatory Visit: Payer: Managed Care, Other (non HMO) | Admitting: Cardiology

## 2017-06-12 VITALS — BP 144/92 | HR 77 | Ht 66.0 in | Wt 189.0 lb

## 2017-06-12 DIAGNOSIS — I10 Essential (primary) hypertension: Secondary | ICD-10-CM | POA: Diagnosis not present

## 2017-06-12 DIAGNOSIS — I77810 Thoracic aortic ectasia: Secondary | ICD-10-CM

## 2017-06-12 DIAGNOSIS — E782 Mixed hyperlipidemia: Secondary | ICD-10-CM | POA: Diagnosis not present

## 2017-06-12 DIAGNOSIS — R42 Dizziness and giddiness: Secondary | ICD-10-CM

## 2017-06-12 MED ORDER — LOSARTAN POTASSIUM 50 MG PO TABS: 50.0000 mg | ORAL_TABLET | Freq: Every day | ORAL | 3 refills | Status: DC

## 2017-06-12 NOTE — Patient Instructions (Signed)
Medication Instructions:  1. START LOSARTAN 50 MG DAILY; RX HAS BEEN SENT IN   Labwork: NONE ORDERED TODAY  Testing/Procedures: YOU WILL NEED TO SCHEDULE A CHEST CT-A, CALL OUR OFFICE WHEN YOU ARE READY TO SCHEDULE TESTING [id number]  Follow-Up: Your physician wants you to follow-up in: 6 MONTHS WITH DR. END  You will receive a reminder letter in the mail two months in advance. If you don't receive a letter, please call our office to schedule the follow-up appointment.   Any Other Special Instructions Will Be Listed Below (If Applicable).  MONITOR BLOOD PRESSURE AND CALL THE OFFICE IS CONSISTENTLY RUNNING 130/80 OR HIGHER, IF RUNNING HIGHER WE MAY NEED TO INCREASE LOSARTAN; PER NP SHE WOULD LIKE TO SEE SYSTOLIC BLOOD PRESSURE (TOP NUMBER) BETWEEN 105-120  If you need a refill on your cardiac medications before your next appointment, please call your pharmacy.   DASH Eating Plan DASH stands for "Dietary Approaches to Stop Hypertension." The DASH eating plan is a healthy eating plan that has been shown to reduce high blood pressure (hypertension). It may also reduce your risk for type 2 diabetes, heart disease, and stroke. The DASH eating plan may also help with weight loss. What are tips for following this plan? General guidelines  Avoid eating more than 2,300 mg (milligrams) of salt (sodium) a day. If you have hypertension, you may need to reduce your sodium intake to 1,500 mg a day.  Limit alcohol intake to no more than 1 drink a day for nonpregnant women and 2 drinks a day for men. One drink equals 12 oz of beer, 5 oz of wine, or 1 oz of hard liquor.  Work with your health care provider to maintain a healthy body weight or to lose weight. Ask what an ideal weight is for you.  Get at least 30 minutes of exercise that causes your heart to beat faster (aerobic exercise) most days of the week. Activities may include walking, swimming, or biking.  Work with your health care provider  or diet and nutrition specialist (dietitian) to adjust your eating plan to your individual calorie needs. Reading food labels  Check food labels for the amount of sodium per serving. Choose foods with less than 5 percent of the Daily Value of sodium. Generally, foods with less than 300 mg of sodium per serving fit into this eating plan.  To find whole grains, look for the word "whole" as the first word in the ingredient list. Shopping  Buy products labeled as "low-sodium" or "no salt added."  Buy fresh foods. Avoid canned foods and premade or frozen meals. Cooking  Avoid adding salt when cooking. Use salt-free seasonings or herbs instead of table salt or sea salt. Check with your health care provider or pharmacist before using salt substitutes.  Do not fry foods. Cook foods using healthy methods such as baking, boiling, grilling, and broiling instead.  Cook with heart-healthy oils, such as olive, canola, soybean, or sunflower oil. Meal planning   Eat a balanced diet that includes: ? 5 or more servings of fruits and vegetables each day. At each meal, try to fill half of your plate with fruits and vegetables. ? Up to 6-8 servings of whole grains each day. ? Less than 6 oz of lean meat, poultry, or fish each day. A 3-oz serving of meat is about the same size as a deck of cards. One egg equals 1 oz. ? 2 servings of low-fat dairy each day. ? A serving of nuts,  seeds, or beans 5 times each week. ? Heart-healthy fats. Healthy fats called Omega-3 fatty acids are found in foods such as flaxseeds and coldwater fish, like sardines, salmon, and mackerel.  Limit how much you eat of the following: ? Canned or prepackaged foods. ? Food that is high in trans fat, such as fried foods. ? Food that is high in saturated fat, such as fatty meat. ? Sweets, desserts, sugary drinks, and other foods with added sugar. ? Full-fat dairy products.  Do not salt foods before eating.  Try to eat at least 2  vegetarian meals each week.  Eat more home-cooked food and less restaurant, buffet, and fast food.  When eating at a restaurant, ask that your food be prepared with less salt or no salt, if possible. What foods are recommended? The items listed may not be a complete list. Talk with your dietitian about what dietary choices are best for you. Grains Whole-grain or whole-wheat bread. Whole-grain or whole-wheat pasta. Brown rice. Orpah Cobb. Bulgur. Whole-grain and low-sodium cereals. Pita bread. Low-fat, low-sodium crackers. Whole-wheat flour tortillas. Vegetables Fresh or frozen vegetables (raw, steamed, roasted, or grilled). Low-sodium or reduced-sodium tomato and vegetable juice. Low-sodium or reduced-sodium tomato sauce and tomato paste. Low-sodium or reduced-sodium canned vegetables. Fruits All fresh, dried, or frozen fruit. Canned fruit in natural juice (without added sugar). Meat and other protein foods Skinless chicken or [place]. Ground chicken or [place]. Pork with fat trimmed off. Fish and seafood. Egg whites. Dried beans, peas, or lentils. Unsalted nuts, nut butters, and seeds. Unsalted canned beans. Lean cuts of beef with fat trimmed off. Low-sodium, lean deli meat. Dairy Low-fat (1%) or fat-free (skim) milk. Fat-free, low-fat, or reduced-fat cheeses. Nonfat, low-sodium ricotta or cottage cheese. Low-fat or nonfat yogurt. Low-fat, low-sodium cheese. Fats and oils Soft margarine without trans fats. Vegetable oil. Low-fat, reduced-fat, or light mayonnaise and salad dressings (reduced-sodium). Canola, safflower, olive, soybean, and sunflower oils. Avocado. Seasoning and other foods Herbs. Spices. Seasoning mixes without salt. Unsalted popcorn and pretzels. Fat-free sweets. What foods are not recommended? The items listed may not be a complete list. Talk with your dietitian about what dietary choices are best for you. Grains Baked goods made with fat, such as croissants, muffins, or  some breads. Dry pasta or rice meal packs. Vegetables Creamed or fried vegetables. Vegetables in a cheese sauce. Regular canned vegetables (not low-sodium or reduced-sodium). Regular canned tomato sauce and paste (not low-sodium or reduced-sodium). Regular tomato and vegetable juice (not low-sodium or reduced-sodium). Rosita Fire. Olives. Fruits Canned fruit in a light or heavy syrup. Fried fruit. Fruit in cream or butter sauce. Meat and other protein foods Fatty cuts of meat. Ribs. Fried meat. Tomasa Blase. Sausage. Bologna and other processed lunch meats. Salami. Fatback. Hotdogs. Bratwurst. Salted nuts and seeds. Canned beans with added salt. Canned or smoked fish. Whole eggs or egg yolks. Chicken or [place] with skin. Dairy Whole or 2% milk, cream, and half-and-half. Whole or full-fat cream cheese. Whole-fat or sweetened yogurt. Full-fat cheese. Nondairy creamers. Whipped toppings. Processed cheese and cheese spreads. Fats and oils Butter. Stick margarine. Lard. Shortening. Ghee. Bacon fat. Tropical oils, such as coconut, palm kernel, or palm oil. Seasoning and other foods Salted popcorn and pretzels. Onion salt, garlic salt, seasoned salt, table salt, and sea salt. Worcestershire sauce. Tartar sauce. Barbecue sauce. Teriyaki sauce. Soy sauce, including reduced-sodium. Steak sauce. Canned and packaged gravies. Fish sauce. Oyster sauce. Cocktail sauce. Horseradish that you find on the shelf. Ketchup. Mustard. Meat flavorings and  tenderizers. Bouillon cubes. Hot sauce and Tabasco sauce. Premade or packaged marinades. Premade or packaged taco seasonings. Relishes. Regular salad dressings. Where to find more information:  National Heart, Lung, and Blood Institute: PopSteam.is  American Heart Association: www.heart.org Summary  The DASH eating plan is a healthy eating plan that has been shown to reduce high blood pressure (hypertension). It may also reduce your risk for type 2 diabetes, heart disease,  and stroke.  With the DASH eating plan, you should limit salt (sodium) intake to 2,300 mg a day. If you have hypertension, you may need to reduce your sodium intake to 1,500 mg a day.  When on the DASH eating plan, aim to eat more fresh fruits and vegetables, whole grains, lean proteins, low-fat dairy, and heart-healthy fats.  Work with your health care provider or diet and nutrition specialist (dietitian) to adjust your eating plan to your individual calorie needs. This information is not intended to replace advice given to you by your health care provider. Make sure you discuss any questions you have with your health care provider. Document Released: 12/30/2010 Document Revised: 01/04/2016 Document Reviewed: 01/04/2016 Elsevier Interactive Patient Education  [employer].

## 2017-06-22 ENCOUNTER — Ambulatory Visit (INDEPENDENT_AMBULATORY_CARE_PROVIDER_SITE_OTHER)
Admission: RE | Admit: 2017-06-22 | Discharge: 2017-06-22 | Disposition: A | Discharge status: Home / Self Care | Payer: Managed Care, Other (non HMO) | Source: Ambulatory Visit | Attending: Cardiology | Admitting: Cardiology

## 2017-06-22 DIAGNOSIS — I77810 Thoracic aortic ectasia: Secondary | ICD-10-CM | POA: Diagnosis not present

## 2017-06-22 MED ORDER — IOPAMIDOL (ISOVUE-370) INJECTION 76%
100.0000 mL | Freq: Once | INTRAVENOUS | Status: AC | PRN
  Administered 2017-06-22: 100 mL via INTRAVENOUS

## 2017-06-27 ENCOUNTER — Other Ambulatory Visit: Payer: Self-pay | Admitting: *Deleted

## 2017-06-27 DIAGNOSIS — I77810 Thoracic aortic ectasia: Secondary | ICD-10-CM

## 2017-06-27 NOTE — Progress Notes (Signed)
Notes recorded by Berton BonHammond, Janine, NP on 06/27/2017 at 11:08 AM EDT Please let pt know that the CTA confirmed similar findings with the echo, mildly dilated thoracic aorta. We will follow up with CTA in 6 months and if that is unchanged we can go to yearly monitoring.

## 2017-07-14 ENCOUNTER — Telehealth: Payer: Self-pay | Admitting: Internal Medicine

## 2017-07-14 DIAGNOSIS — N23 Unspecified renal colic: Secondary | ICD-10-CM

## 2017-07-14 NOTE — Telephone Encounter (Signed)
I wonder if his BP got too low. What has his BP been running at home. We could decrease the losartan to 25 mg daily if his BP is low.

## 2017-07-14 NOTE — Telephone Encounter (Signed)
Pt calls today with concerns with his losartan. He states the first few days he was fatigued but has since resolved. He states one morning he felt anxious and contributes this to losartan. He also c/o kidney pain. I advised him I was concerned with his kidney pain and have set up an appointment to have a BMP drawn on Monday. I asked him to come in today, but he was unable to do so. As far as the anxiety, I advised I did not think this was contributed to Losartan, as it is not a typical side effect. He states it only happened once so I told him I believed it could be an isolated event.  He states he has not taken his losartan. With his history of Aortic root dilation, I advised him to continue his Losartan to keep his BP under control. We discussed possible consequence of increased BP with ARD and he verbalized understanding.   I stated I would forward his concerns to Dr End and Berton BonJanine Hammond for further review.

## 2017-07-14 NOTE — Telephone Encounter (Signed)
Pt states his SBP > 125 but lower than 130.

## 2017-07-14 NOTE — Telephone Encounter (Signed)
I agree with Coralee NorthNina.  Symptoms are unusual for losartan, but if BP is low or symptoms persist, could try cutting down to 25 mg daily.  Yvonne Kendallhristopher Iolanda Folson, MD [facility] Pager: [phone]

## 2017-07-14 NOTE — Telephone Encounter (Signed)
New Message    Pt c/o medication issue:  1. Name of Medication: losartan  2. How are you currently taking this medication (dosage and times per day)? 50 mg once a day    3. Are you having a reaction (difficulty breathing--STAT)? Anxiety  4. What is your medication issue? Patient states that this medication is causing him to have anxiety, fatigue and absolutely no energy

## 2017-07-17 ENCOUNTER — Other Ambulatory Visit: Payer: Managed Care, Other (non HMO) | Admitting: *Deleted

## 2017-07-17 DIAGNOSIS — N23 Unspecified renal colic: Secondary | ICD-10-CM

## 2017-07-17 LAB — BASIC METABOLIC PANEL
BUN/Creatinine Ratio: 11 (ref 9–20)
BUN: 11 mg/dL (ref 6–24)
CALCIUM: 10.4 mg/dL — AB (ref 8.7–10.2)
CO2: 25 mmol/L (ref 20–29)
CREATININE: 0.96 mg/dL (ref 0.76–1.27)
Chloride: 100 mmol/L (ref 96–106)
GFR calc Af Amer: 111 mL/min/{1.73_m2} (ref >59)
GFR, EST NON AFRICAN AMERICAN: 96 mL/min/{1.73_m2} (ref >59)
GLUCOSE: 107 mg/dL — AB (ref 65–99)
POTASSIUM: 4.7 mmol/L (ref 3.5–5.2)
Sodium: 140 mmol/L (ref 134–144)

## 2017-07-17 NOTE — Telephone Encounter (Signed)
I agree that his symptoms are unlikely to be due to medication.  I recommend that he touch base with his PCP if the symptoms continue.  Yvonne Kendallhristopher Shannan Garfinkel, MD [facility] Pager: [phone]

## 2017-07-18 NOTE — Telephone Encounter (Signed)
Spoke with pt regarding his lab results and recommendations to continue on his Losartan. Pt verbalized understanding and had no additional questions.

## 2017-12-29 ENCOUNTER — Ambulatory Visit (INDEPENDENT_AMBULATORY_CARE_PROVIDER_SITE_OTHER)
Admission: RE | Admit: 2017-12-29 | Discharge: 2017-12-29 | Disposition: A | Discharge status: Home / Self Care | Payer: Managed Care, Other (non HMO) | Source: Ambulatory Visit | Attending: Internal Medicine | Admitting: Internal Medicine

## 2017-12-29 DIAGNOSIS — I77810 Thoracic aortic ectasia: Secondary | ICD-10-CM

## 2017-12-29 MED ORDER — IOPAMIDOL (ISOVUE-370) INJECTION 76%
100.0000 mL | Freq: Once | INTRAVENOUS | Status: AC | PRN
  Administered 2017-12-29: 100 mL via INTRAVENOUS

## 2018-01-01 ENCOUNTER — Telehealth: Payer: Self-pay

## 2018-01-01 NOTE — Telephone Encounter (Signed)
Notes recorded by Sigurd Sosapp, Rashida Ladouceur, RN on 01/01/2018 at 11:20 AM EST lpmtcb 12/9 ------

## 2018-01-01 NOTE — Telephone Encounter (Signed)
-----   Message from Christopher End, MD sent at 12/31/2017 11:00 AM EST ----- Please let Mr. Condrey know that the CTA of his chest shows upper normal size of his aorta.  Maximal diameter is actually slightly less than on prior study.  Left thyroid nodule was incidentally noted.  I recommend that we obtain a thyroid ultrasound to further characterize this thyroid nodule.  No further aortic imaging is recommended at this time. 

## 2018-01-02 ENCOUNTER — Telehealth: Payer: Self-pay

## 2018-01-02 DIAGNOSIS — E041 Nontoxic single thyroid nodule: Secondary | ICD-10-CM

## 2018-01-02 NOTE — Telephone Encounter (Signed)
-----   Message from Yvonne Kendallhristopher End, MD sent at 12/31/2017 11:00 AM EST ----- Please let [name] know that the CTA of his chest shows upper normal size of his aorta.  Maximal diameter is actually slightly less than on prior study.  Left thyroid nodule was incidentally noted.  I recommend that we obtain a thyroid ultrasound to further characterize this thyroid nodule.  No further aortic imaging is recommended at this time.

## 2018-01-02 NOTE — Telephone Encounter (Signed)
Notes recorded by Sigurd Sosapp, Ryhanna Dunsmore, RN on 01/02/2018 at 8:20 AM EST The patient has been notified of the result and verbalized understanding. I told patient that I would order the thyroid US and that he would be called to schedule at hospital. All questions (if any) were answered. Sigurd SosMichael Tara Rud, RN 01/02/2018 8:19 AM

## 2018-01-15 ENCOUNTER — Ambulatory Visit (HOSPITAL_COMMUNITY)
Admission: RE | Admit: 2018-01-15 | Discharge: 2018-01-15 | Disposition: A | Discharge status: Home / Self Care | Payer: Managed Care, Other (non HMO) | Source: Ambulatory Visit | Attending: Internal Medicine | Admitting: Internal Medicine

## 2018-01-15 DIAGNOSIS — E041 Nontoxic single thyroid nodule: Secondary | ICD-10-CM | POA: Diagnosis present

## 2018-01-31 ENCOUNTER — Telehealth: Payer: Self-pay

## 2018-01-31 NOTE — Telephone Encounter (Signed)
Notes recorded by Sigurd Sosapp, Autym Siess, RN on 01/31/2018 at 9:38 AM EST lpmtcb 1/8 ------

## 2018-02-01 ENCOUNTER — Telehealth: Payer: Self-pay

## 2018-02-01 NOTE — Telephone Encounter (Signed)
Notes recorded by Sigurd Sos, RN on 02/01/2018 at 10:36 AM EST lpmtcb 1/9

## 2018-02-02 ENCOUNTER — Telehealth: Payer: Self-pay

## 2018-02-02 ENCOUNTER — Other Ambulatory Visit: Payer: Self-pay | Admitting: Family Medicine

## 2018-02-02 DIAGNOSIS — E041 Nontoxic single thyroid nodule: Secondary | ICD-10-CM

## 2018-02-02 NOTE — Telephone Encounter (Signed)
Notes recorded by Sigurd Sos, RN on 02/02/2018 at 9:21 AM EST Mailed patient a letter, informing him to contact office. 02/02/2018 ------

## 2018-02-13 ENCOUNTER — Ambulatory Visit
Admission: RE | Admit: 2018-02-13 | Discharge: 2018-02-13 | Disposition: A | Discharge status: Home / Self Care | Payer: Managed Care, Other (non HMO) | Source: Ambulatory Visit | Attending: Family Medicine | Admitting: Family Medicine

## 2018-02-13 ENCOUNTER — Other Ambulatory Visit (HOSPITAL_COMMUNITY)
Admission: RE | Admit: 2018-02-13 | Discharge: 2018-02-13 | Disposition: A | Discharge status: Home / Self Care | Payer: Managed Care, Other (non HMO) | Source: Ambulatory Visit | Attending: Physician Assistant | Admitting: Physician Assistant

## 2018-02-13 DIAGNOSIS — E041 Nontoxic single thyroid nodule: Secondary | ICD-10-CM | POA: Diagnosis present

## 2018-06-01 ENCOUNTER — Encounter (HOSPITAL_COMMUNITY): Payer: Self-pay | Admitting: Student

## 2018-06-01 ENCOUNTER — Other Ambulatory Visit: Payer: Self-pay

## 2018-06-01 ENCOUNTER — Emergency Department (HOSPITAL_COMMUNITY): Payer: Managed Care, Other (non HMO)

## 2018-06-01 ENCOUNTER — Emergency Department (HOSPITAL_COMMUNITY)
Admission: EM | Admit: 2018-06-01 | Discharge: 2018-06-01 | Disposition: A | Discharge status: Home / Self Care | Payer: Managed Care, Other (non HMO) | Attending: Emergency Medicine | Admitting: Emergency Medicine

## 2018-06-01 DIAGNOSIS — R110 Nausea: Secondary | ICD-10-CM | POA: Diagnosis not present

## 2018-06-01 DIAGNOSIS — K5100 Ulcerative (chronic) pancolitis without complications: Secondary | ICD-10-CM | POA: Diagnosis not present

## 2018-06-01 DIAGNOSIS — Z79899 Other long term (current) drug therapy: Secondary | ICD-10-CM | POA: Insufficient documentation

## 2018-06-01 DIAGNOSIS — I10 Essential (primary) hypertension: Secondary | ICD-10-CM | POA: Diagnosis not present

## 2018-06-01 DIAGNOSIS — F17290 Nicotine dependence, other tobacco product, uncomplicated: Secondary | ICD-10-CM | POA: Diagnosis not present

## 2018-06-01 DIAGNOSIS — R509 Fever, unspecified: Secondary | ICD-10-CM | POA: Diagnosis not present

## 2018-06-01 DIAGNOSIS — R1084 Generalized abdominal pain: Secondary | ICD-10-CM | POA: Diagnosis not present

## 2018-06-01 DIAGNOSIS — R197 Diarrhea, unspecified: Secondary | ICD-10-CM | POA: Diagnosis present

## 2018-06-01 LAB — COMPREHENSIVE METABOLIC PANEL
ALT: 12 U/L (ref 0–44)
AST: 14 U/L — ABNORMAL LOW (ref 15–41)
Albumin: 2.7 g/dL — ABNORMAL LOW (ref 3.5–5.0)
Alkaline Phosphatase: 61 U/L (ref 38–126)
Anion gap: 12 (ref 5–15)
BUN: 5 mg/dL — ABNORMAL LOW (ref 6–20)
CO2: 24 mmol/L (ref 22–32)
Calcium: 8.3 mg/dL — ABNORMAL LOW (ref 8.9–10.3)
Chloride: 96 mmol/L — ABNORMAL LOW (ref 98–111)
Creatinine, Ser: 0.92 mg/dL (ref 0.61–1.24)
GFR calc Af Amer: >60 mL/min (ref >60)
GFR calc non Af Amer: >60 mL/min (ref >60)
Glucose, Bld: 114 mg/dL — ABNORMAL HIGH (ref 70–99)
Potassium: 3.2 mmol/L — ABNORMAL LOW (ref 3.5–5.1)
Sodium: 132 mmol/L — ABNORMAL LOW (ref 135–145)
Total Bilirubin: 0.8 mg/dL (ref 0.3–1.2)
Total Protein: 6.1 g/dL — ABNORMAL LOW (ref 6.5–8.1)

## 2018-06-01 LAB — CBC WITH DIFFERENTIAL/PLATELET
Abs Immature Granulocytes: 0 10*3/uL (ref 0.00–0.07)
Basophils Absolute: 0 10*3/uL (ref 0.0–0.1)
Basophils Relative: 0 %
Eosinophils Absolute: 0.2 10*3/uL (ref 0.0–0.5)
Eosinophils Relative: 2 %
HCT: 39.8 % (ref 39.0–52.0)
Hemoglobin: 13.1 g/dL (ref 13.0–17.0)
Lymphocytes Relative: 17 %
Lymphs Abs: 1.8 10*3/uL (ref 0.7–4.0)
MCH: 29.7 pg (ref 26.0–34.0)
MCHC: 32.9 g/dL (ref 30.0–36.0)
MCV: 90.2 fL (ref 80.0–100.0)
Monocytes Absolute: 1.6 10*3/uL — ABNORMAL HIGH (ref 0.1–1.0)
Monocytes Relative: 15 %
Neutro Abs: 6.9 10*3/uL (ref 1.7–7.7)
Neutrophils Relative %: 66 %
Platelets: 391 10*3/uL (ref 150–400)
RBC: 4.41 MIL/uL (ref 4.22–5.81)
RDW: 11.9 % (ref 11.5–15.5)
WBC Morphology: INCREASED
WBC: 10.5 10*3/uL (ref 4.0–10.5)
nRBC: 0 % (ref 0.0–0.2)

## 2018-06-01 LAB — LIPASE, BLOOD: Lipase: 21 U/L (ref 11–51)

## 2018-06-01 LAB — MAGNESIUM: Magnesium: 2.1 mg/dL (ref 1.7–2.4)

## 2018-06-01 LAB — POC OCCULT BLOOD, ED: Fecal Occult Bld: POSITIVE — AB

## 2018-06-01 MED ORDER — MORPHINE SULFATE (PF) 4 MG/ML IV SOLN
4.0000 mg | Freq: Once | INTRAVENOUS | Status: AC
  Administered 2018-06-01: 4 mg via INTRAVENOUS
  Filled 2018-06-01: qty 1

## 2018-06-01 MED ORDER — ONDANSETRON HCL 4 MG/2ML IJ SOLN
4.0000 mg | Freq: Once | INTRAMUSCULAR | Status: AC
  Administered 2018-06-01: 4 mg via INTRAVENOUS
  Filled 2018-06-01: qty 2

## 2018-06-01 MED ORDER — METRONIDAZOLE 500 MG PO TABS: 500.0000 mg | ORAL_TABLET | Freq: Three times a day (TID) | ORAL | 0 refills | Status: DC

## 2018-06-01 MED ORDER — OXYCODONE-ACETAMINOPHEN 5-325 MG PO TABS: 2.0000 | ORAL_TABLET | Freq: Four times a day (QID) | ORAL | 0 refills | Status: DC | PRN

## 2018-06-01 MED ORDER — IOHEXOL 300 MG/ML  SOLN
100.0000 mL | Freq: Once | INTRAMUSCULAR | Status: AC
  Administered 2018-06-01: 100 mL via INTRAVENOUS

## 2018-06-01 MED ORDER — CIPROFLOXACIN IN D5W 400 MG/200ML IV SOLN
400.0000 mg | Freq: Once | INTRAVENOUS | Status: AC
  Administered 2018-06-01: 400 mg via INTRAVENOUS
  Filled 2018-06-01: qty 200

## 2018-06-01 MED ORDER — CIPROFLOXACIN HCL 500 MG PO TABS: 500.0000 mg | ORAL_TABLET | Freq: Two times a day (BID) | ORAL | 0 refills | Status: DC

## 2018-06-01 MED ORDER — SODIUM CHLORIDE 0.9 % IV BOLUS
1000.0000 mL | Freq: Once | INTRAVENOUS | Status: AC
  Administered 2018-06-01: 1000 mL via INTRAVENOUS

## 2018-06-01 MED ORDER — ONDANSETRON 4 MG PO TBDP: 4.0000 mg | ORAL_TABLET | Freq: Three times a day (TID) | ORAL | 0 refills | Status: DC | PRN

## 2018-06-01 MED ORDER — POTASSIUM CHLORIDE CRYS ER 20 MEQ PO TBCR
40.0000 meq | EXTENDED_RELEASE_TABLET | Freq: Once | ORAL | Status: AC
  Administered 2018-06-01: 40 meq via ORAL
  Filled 2018-06-01: qty 2

## 2018-06-01 MED ORDER — METRONIDAZOLE IN NACL 5-0.79 MG/ML-% IV SOLN
500.0000 mg | Freq: Once | INTRAVENOUS | Status: AC
  Administered 2018-06-01: 500 mg via INTRAVENOUS
  Filled 2018-06-01: qty 100

## 2018-06-01 NOTE — ED Notes (Signed)
Patient transported to CT 

## 2018-06-01 NOTE — ED Provider Notes (Signed)
[facility] EMERGENCY DEPARTMENT Provider Note   CSN: [id number] Arrival date & time: 06/01/18  1440    History   Chief Complaint Chief Complaint  Patient presents with  . Abdominal Pain  . Diarrhea    HPI JAIS DEMIR is a 43 y.o. male with a hx of HTN, hyperlipidemia, aortic root dilation, and prior appendectomy who presents to the ED with complaints of diarrhea x 4 weeks and abdominal pain x 3 days. Patient notes minimum of 10 episodes of diarrhea daily, was initially have some bloody diarrhea- this became dark in color, then blood aspect seemed to resolve 5 days ago- diarrhea has persisted. States that he developed abdominal discomfort (generalized dull aching worse in the lower abdomen) w/ nausea & fever to 101 over the past 3 days. Pain is constant, worse w/ movement, no alleviating factors. Denies emesis, hematemesis, dysuria, testicular pain/swelling, chest pain, or dyspnea. Denies recent travel, abx, or recent hospitalization.      HPI  Past Medical History:  Diagnosis Date  . Hypertension     Patient Active Problem List   Diagnosis Date Noted  . Aortic root dilatation (HCC) 06/11/2017  . Hyperlipidemia 06/11/2017  . Lightheadedness 05/12/2017  . Palpitations 05/12/2017  . Essential hypertension 05/12/2017  . PVC's (premature ventricular contractions) 05/12/2017    Past Surgical History:  Procedure Laterality Date  . APPENDECTOMY    . THROAT SURGERY          Home Medications    Prior to Admission medications   Medication Sig Start Date End Date Taking? Authorizing Provider  losartan (COZAAR) 50 MG tablet Take 1 tablet (50 mg total) by mouth daily. 06/12/17 09/10/17  Berton Bon, NP  Multiple Vitamin (MULTIVITAMIN WITH MINERALS) TABS tablet Take 1 tablet by mouth daily.    [provider]    Family History Family History  Problem Relation Age of Onset  . Healthy Mother   . Other Father        pacemaker  . Bradycardia  Father   . Healthy Sister   . Heart disease Paternal Grandfather     Social History Social History   Tobacco Use  . Smoking status: Former Smoker    Packs/day: 10.00    Years: 1.00    Pack years: 10.00    Types: Cigarettes    Last attempt to quit: 07/28/2002    Years since quitting: 15.8  . Smokeless tobacco: Current User    Types: Snuff  Substance Use Topics  . Alcohol use: Yes    Alcohol/week: 25.0 standard drinks    Types: 25 Cans of beer per week    Comment: daily  . Drug use: No     Allergies   Patient has no known allergies.   Review of Systems Review of Systems  Constitutional: Positive for fever.  Respiratory: Negative for shortness of breath.   Cardiovascular: Negative for chest pain.  Gastrointestinal: Positive for abdominal pain, blood in stool, diarrhea and nausea. Negative for constipation and vomiting.  Genitourinary: Negative for dysuria, scrotal swelling and testicular pain.  Neurological: Negative for syncope.  All other systems reviewed and are negative.    Physical Exam Updated Vital Signs There were no vitals taken for this visit.  Physical Exam Vitals signs and nursing note reviewed. Exam conducted with a chaperone present.  Constitutional:      General: He is not in acute distress.    Appearance: He is well-developed. He is not toxic-appearing.  HENT:  Head: Normocephalic and atraumatic.     Mouth/Throat:     Comments: Dry mucous membranes.  Eyes:     General:        Right eye: No discharge.        Left eye: No discharge.     Conjunctiva/sclera: Conjunctivae normal.  Neck:     Musculoskeletal: Neck supple.  Cardiovascular:     Rate and Rhythm: Normal rate and regular rhythm.  Pulmonary:     Effort: Pulmonary effort is normal. No respiratory distress.     Breath sounds: Normal breath sounds. No wheezing, rhonchi or rales.  Abdominal:     General: There is no distension.     Palpations: Abdomen is soft.     Tenderness: There  is abdominal tenderness in the right lower quadrant, epigastric area, periumbilical area, suprapubic area and left lower quadrant. There is no guarding or rebound.  Genitourinary:    Comments: RN Mallory present as chaperone.  DRE with minimal stool in rectal vault, obtainable stool appears light brown. No melena or bright red blood.  Skin:    General: Skin is warm and dry.     Findings: No rash.  Neurological:     Mental Status: He is alert.     Comments: Clear speech.   Psychiatric:        Behavior: Behavior normal.    ED Treatments / Results  Labs (all labs ordered are listed, but only abnormal results are displayed) Labs Reviewed  COMPREHENSIVE METABOLIC PANEL - Abnormal; Notable for the following components:      Result Value   Sodium 132 (*)    Potassium 3.2 (*)    Chloride 96 (*)    Glucose, Bld 114 (*)    BUN 5 (*)    Calcium 8.3 (*)    Total Protein 6.1 (*)    Albumin 2.7 (*)    AST 14 (*)    All other components within normal limits  POC OCCULT BLOOD, ED - Abnormal; Notable for the following components:   Fecal Occult Bld POSITIVE (*)    All other components within normal limits  C DIFFICILE QUICK SCREEN W PCR REFLEX  GASTROINTESTINAL PANEL BY PCR, STOOL (REPLACES STOOL CULTURE)  CBC WITH DIFFERENTIAL/PLATELET  LIPASE, BLOOD  URINALYSIS, ROUTINE W REFLEX MICROSCOPIC  MAGNESIUM    EKG None  Radiology Ct Abdomen Pelvis W Contrast  Result Date: 06/01/2018 CLINICAL DATA:  Abdominal pain EXAM: CT ABDOMEN AND PELVIS WITH CONTRAST TECHNIQUE: Multidetector CT imaging of the abdomen and pelvis was performed using the standard protocol following bolus administration of intravenous contrast. CONTRAST:  OMNIPAQUE IOHEXOL 300 MG/ML  SOLN COMPARISON:  None. FINDINGS: Lower chest: There is a small pleural-based nodule in the peripheral right middle lobe, likely benign. Hepatobiliary: There is no discrete hepatic mass. There is periportal edema. The gallbladder is  moderately distended. There is no biliary ductal dilatation. Pancreas: Unremarkable. No pancreatic ductal dilatation or surrounding inflammatory changes. Spleen: Normal in size without focal abnormality. Adrenals/Urinary Tract: There is a 5-6 mm nonobstructing stone in the upper pole of the right kidney. No radiopaque stones identified in the left kidney. There is no hydronephrosis the urinary bladder is moderately distended. Stomach/Bowel: There is diffuse circumferential wall thickening of virtually all of the colon, greatest at the level of the sigmoid colon. The appendix is not readily identified and may be surgically absent per history. No evidence of a small-bowel obstruction. The stomach is unremarkable. Vascular/Lymphatic: There are mildly prominent retroperitoneal  lymph nodes. Aortic atherosclerosis is noted without evidence of abdominal aortic aneurysm. Reproductive: Prostate is unremarkable. Other: There is a fat containing right-sided spigelian hernia. There are bilateral symmetric fluid density structures involving both inguinal regions, a finding which may be related to prior inguinal hernia repair or a small amount of reactive free fluid. Musculoskeletal: No acute or significant osseous findings. IMPRESSION: 1. Overall findings consistent with infectious or inflammatory pancolitis. No evidence of a perforation. No abscess formation. 2. Periportal edema, which may be related to the patient's volume status. 3. Distended urinary bladder. 4. Fat containing right-sided spigelian hernia. 5. Nonobstructing 6 mm stone in the upper pole the right kidney. Electronically Signed   By: Katherine Mantlehristopher  Green M.D.   On: 06/01/2018 17:16    Procedures Procedures (including critical care time)  Medications Ordered in ED Medications  sodium chloride 0.9 % bolus 1,000 mL (0 mLs Intravenous Stopped 06/01/18 1758)  ondansetron (ZOFRAN) injection 4 mg (4 mg Intravenous Given 06/01/18 1543)  morphine 4 MG/ML injection 4  mg (4 mg Intravenous Given 06/01/18 1543)  iohexol (OMNIPAQUE) 300 MG/ML solution 100 mL (100 mLs Intravenous Contrast Given 06/01/18 1700)  ciprofloxacin (CIPRO) IVPB 400 mg (0 mg Intravenous Stopped 06/01/18 1849)  metroNIDAZOLE (FLAGYL) IVPB 500 mg (0 mg Intravenous Stopped 06/01/18 1953)  potassium chloride SA (K-DUR) CR tablet 40 mEq (40 mEq Oral Given 06/01/18 1750)     Initial Impression / Assessment and Plan / ED Course  I have reviewed the triage vital signs and the nursing notes.  Pertinent labs & imaging results that were available during my care of the patient were reviewed by me and considered in my medical decision making (see chart for details).   Patient presents to the ED w/ diarrhea x 4 weeks & abdominal pain x 3 days. Nontoxic appearing, no apparent distress, vitals WNL- there is incorrect documentation of a sat of 72%, patient satting in the 90s throughout stay- RN verified by mistake. Exam with epigastric, periumbilical, and lower abdominal tenderness to palpation wo peritoneal signs. Plan for labs & CT abdomen pelvis. Will order c.diff & stool cultures should patient be able to provide stool sample in the ER.   Work-up reviewed:  CBC: No leukocytosis. Hgb/hct WNL.  CMP: mild electrolyte abnormalities as above- receiving IV NS and will give PO potassium. Creatinine WNL. LFTs WNL Magnesium added on: WNL Lipase: WNL Fecal occult: positive.  CT abdomen/pelvis: 1. Overall findings consistent with infectious or inflammatory pancolitis. No evidence of a perforation. No abscess formation. 2. Periportal edema, which may be related to the patient's volume status. 3. Distended urinary bladder. 4. Fat containing right-sided spigelian hernia. 5. Nonobstructing 6 mm stone in the upper pole the right kidney.  Suspect colitis as etiology of patient's sxs. Fecal occult is positive- suspect this is secondary to colitis, hgb/hct WNL, states bleeding significantly improved from when diarrhea initially  began. Has been unable to provide stool sample. Abdominal pain improved tolerating PO. Discussed findings and plan of care w/ supervising physician Dr. Pilar PlateBero who is in agreement w/ plan for discharge home on cipro/flagyl w/ pain control and strict return precautions especially regarding bleeding w/ GI follow up. I discussed results, treatment plan, need for follow-up, and return precautions with the patient. Provided opportunity for questions, patient confirmed understanding and is in agreement with plan.   Vitals:   06/01/18 1933 06/01/18 1935  BP: 105/63 105/63  Pulse: 97 97  Resp:  16  Temp:    SpO2: 96% 97%  Final Clinical Impressions(s) / ED Diagnoses   Final diagnoses:  Pancolitis [facility])    ED Discharge Orders         Ordered    metroNIDAZOLE (FLAGYL) 500 MG tablet  3 times daily     06/01/18 1934    ciprofloxacin (CIPRO) 500 MG tablet  2 times daily     06/01/18 1934    ondansetron (ZOFRAN ODT) 4 MG disintegrating tablet  Every 8 hours PRN     06/01/18 1934    oxyCODONE-acetaminophen (PERCOCET/ROXICET) 5-325 MG tablet  Every 6 hours PRN     06/01/18 1934           Desmond Lope 06/01/18 2011    Sabas Sous, MD 06/02/18 1500

## 2018-06-01 NOTE — ED Triage Notes (Signed)
Pt reports 4 weeks of diarrhea. Pt states generalized abdominal pain non pinpoint for 4 days with an intermittent fever. Denies SOB/Cough. Denies recent anabiotics use.

## 2018-06-01 NOTE — ED Notes (Signed)
Pt given crackers and sprite to drink for PO challenge

## 2018-06-01 NOTE — Discharge Instructions (Addendum)
You are seen in the emergency department today for diarrhea and abdominal pain.  Your CT scan is consistent with colitis.  We suspect you have an infection of your colon which has been leading to your symptoms.  Your electrolytes including your sodium, potassium, calcium, and chloride were all a bit low, please be sure to include potassium and calcium rich foods in your diet.  You are sending home with the following medicines: -Cipro and Flagyl: These are both antibiotics, you need to take these for the next 1 week.  Do not drink alcohol while taking Flagyl as it can be extremely dangerous with severe side effects. - Percocet- this is a narcotic/controlled substance medication that has potential addicting qualities.  We recommend that you take 1-2 tablets every 6 hours as needed for severe pain.  Do not drive or operate heavy machinery when taking this medicine as it can be sedating. Do not drink alcohol or take other sedating medications when taking this medicine for safety reasons.  Keep this out of reach of small children.  Please be aware this medicine has Tylenol in it (325 mg/tab) do not exceed the maximum dose of Tylenol in a day per over the counter recommendations should you decide to supplement with Tylenol over the counter.  -Zofran: This is medication to help with nausea and vomiting, take every 8 hours as needed.  We have prescribed you new medication(s) today. Discuss the medications prescribed today with your pharmacist as they can have adverse effects and interactions with your other medicines including over the counter and prescribed medications. Seek medical evaluation if you start to experience new or abnormal symptoms after taking one of these medicines, seek care immediately if you start to experience difficulty breathing, feeling of your throat closing, facial swelling, or rash as these could be indications of a more serious allergic reaction  The test to check for blood in your stool  was positive.  Would like you to follow-up very closely with gastroenterology within the next 3 days, please call the phone number in your discharge instructions to set up an appointment.  Return to the emergency department immediately for new or worsening symptoms including but not limited to visible blood or dark or tarry stools, crease pain, inability to keep fluids down, fever, blood in vomit, headedness, dizziness, passing out, chest pain, shortness of breath, or any other concerns.

## 2018-06-01 NOTE — ED Notes (Signed)
When validating vitals for pt at 1712, O2 sats of 72% was an incorrect vital. This RN updated his vitals and his reading for his O2 sats. Pts currently O2 sat is 94-95% RA.  PA notified.

## 2018-06-01 NOTE — ED Notes (Signed)
Patient verbalizes understanding of discharge instructions. Opportunity for questioning and answers were provided. Armband removed by staff, pt discharged from ED ambulatory to home.  

## 2018-06-07 ENCOUNTER — Other Ambulatory Visit: Payer: Self-pay | Admitting: Cardiology

## 2018-09-20 ENCOUNTER — Other Ambulatory Visit: Payer: Self-pay | Admitting: Cardiology

## 2018-09-20 MED ORDER — LOSARTAN POTASSIUM 50 MG PO TABS: 50.0000 mg | ORAL_TABLET | Freq: Every day | ORAL | 0 refills | Status: DC

## 2018-09-20 NOTE — Telephone Encounter (Signed)
 *  STAT* If patient is at the pharmacy, call can be transferred to refill team.   1. Which medications need to be refilled? (please list name of each medication and dose if known) losartan (COZAAR) 50 MG tablet  2. Which pharmacy/location (including street and city if local pharmacy) is medication to be sent to? [facility] [place]  3. Do they need a 30 day or 90 day supply? [place]

## 2018-09-20 NOTE — Telephone Encounter (Signed)
Pt's medication was sent to pt's pharmacy as requested. Confirmation received.  °

## 2018-10-18 ENCOUNTER — Other Ambulatory Visit: Payer: Self-pay

## 2018-10-18 ENCOUNTER — Ambulatory Visit: Payer: Managed Care, Other (non HMO) | Admitting: Cardiology

## 2018-10-18 ENCOUNTER — Encounter: Payer: Self-pay | Admitting: Cardiology

## 2018-10-18 VITALS — BP 130/80 | HR 77 | Ht 66.0 in | Wt 208.0 lb

## 2018-10-18 DIAGNOSIS — I77810 Thoracic aortic ectasia: Secondary | ICD-10-CM | POA: Diagnosis not present

## 2018-10-18 DIAGNOSIS — E041 Nontoxic single thyroid nodule: Secondary | ICD-10-CM | POA: Diagnosis not present

## 2018-10-18 DIAGNOSIS — I10 Essential (primary) hypertension: Secondary | ICD-10-CM | POA: Diagnosis not present

## 2018-10-18 DIAGNOSIS — R42 Dizziness and giddiness: Secondary | ICD-10-CM | POA: Diagnosis not present

## 2018-10-18 MED ORDER — LOSARTAN POTASSIUM 50 MG PO TABS: 50.0000 mg | ORAL_TABLET | Freq: Every day | ORAL | 11 refills | Status: DC

## 2018-10-18 NOTE — Patient Instructions (Addendum)
Medication Instructions:  Your physician recommends that you continue on your current medications as directed. Please refer to the Current Medication list given to you today.  If you need a refill on your cardiac medications before your next appointment, please call your pharmacy.   Lab work: FUTURE: BMET 1 week before your CTA  If you have labs (blood work) drawn today and your tests are completely normal, you will receive your results only by: Marland Kitchen MyChart Message (if you have MyChart) OR . A paper copy in the mail If you have any lab test that is abnormal or we need to change your treatment, we will call you to review the results.  Testing/Procedures: Your physician has ordered for you to have a CTA in December   Follow-Up: At [facility], you and your health needs are our priority.  As part of our continuing mission to provide you with exceptional heart care, we have created designated Provider Care Teams.  These Care Teams include your primary Cardiologist (physician) and Advanced Practice Providers (APPs -  Physician Assistants and Nurse Practitioners) who all work together to provide you with the care you need, when you need it. You will need a follow up appointment in 12 months.  Please call our office 2 months in advance to schedule this appointment.  You may see Dr. Bettina Gavia or another member of our [employer] [facility] Provider Team in [place]: Jenne Campus, MD . Jyl Heinz, MD  Any Other Special Instructions Will Be Listed Below (If Applicable).

## 2018-10-18 NOTE — Progress Notes (Signed)
Cardiology Office Note:    Date:  10/18/2018   ID:  Ricardo Odom, DOB 08/04/1975, MRN [id number]  PCP:  Marylen PontoHolt, Lynley S, MD  Cardiologist:  Yvonne Kendallhristopher End, MD  Referring MD: Marylen PontoHolt, Lynley S, MD   Chief Complaint  Patient presents with  . Follow-up    lightheadedness  . Follow-up    aortic dilitation    History of Present Illness:    Ricardo Odom is a 43 y.o. male with a past medical history significant for for hypertension. He was seen for initial evaluation of lightheadedness on 05/11/17 by Dr. Okey DupreEnd. He had presented to [facility] in February with lightheadedness that occurred at work upon standing. Workup in the ED only pointed to orthostatic lightheadedness.   Upon review of his prior health he had noted some pausing of his heart beat about 5-6 years ago. This was evaluated by Dr. Bing MatterKrasowski with a stress test and holter monitor. Dr. Okey DupreEnd felt this was likely to have been PVCs. Since the patient has not noted palpitations except for that singe episode, Outpatient monitor was deferred. Labs were essentially normal, very slightly elevated calcium.  An echocardiogram was done and revealed normal LV systolic function with EF 55-60%, grade 1 DD, mildly dilated aortic root and mild aortic regurgitation.   I saw the patient in the office on 06/12/2017 and the patient was doing much better.  Since the patient's episode of lightheadedness in February he had improved.  He had changed his diet, gave up sodas, most sugar, salt, bread and pasta.  He was losing weight.  He quit smoking on 07/28/2002.   [name] is here for 6465-month follow-up. He is currently on prednisone since May for Ulcerative colitis. This has made him very hungry and his diet has not been as good as it was. He hopes to be off the prednisone in about 3 weeks. He has had no lightheadedness or syncope.   He is a Data processing managermaintenance mechanic, moderate activity. No exercise. He gets occ chest pain, left side that lasts a few seconds  usually when he is sitting around. He thinks may be muscular. No shortness of breath, orthpnea, PND or edema.   He continues to be smoke free.    Cardiac studies:  CTA of the chest 12/29/2017 IMPRESSION: -Maximal diameter of the ascending aorta at the sinus of Valsalva is 4.1 cm compared with 4.4 cm on the prior study. The ascending aorta is nonaneurysmal. -1.4 cm left thyroid nodule is better visualized today. Thyroid ultrasound is recommended. -Right nephrolithiasis. -Aortic Atherosclerosis (ICD10-I70.0).  CTA of the chest 06/22/2017 IMPRESSION: -Maximal diameter of the aorta at the sinus of Valsalva is 4.4 cm. At the sinotubular junction it is 3.4 cm. The ascending aorta is nonaneurysmal. -Aortic Atherosclerosis (ICD10-I70.0).  Echocardiogram 05/25/2017 Study Conclusions - Left ventricle: The cavity size was normal. Wall thickness was normal. Systolic function was normal. The estimated ejection fraction was in the range of 55% to 60%. Wall motion was normal; there were no regional wall motion abnormalities. Doppler parameters are consistent with abnormal left ventricular relaxation (grade 1 diastolic dysfunction). - Aortic valve: There was mild regurgitation. - Aortic root: The aortic root was mildly dilated.  Impressions: - Normal LV systolic function; mild diastolic dysfunction; sclerotic aortic valve with mild AI; mildly dilated aortic root (4.3 cm).    Past Medical History:  Diagnosis Date  . Hypertension     Past Surgical History:  Procedure Laterality Date  . APPENDECTOMY    .  THROAT SURGERY      Current Medications: Current Meds  Medication Sig  . ciprofloxacin (CIPRO) 500 MG tablet Take 1 tablet (500 mg total) by mouth 2 (two) times daily.  Marland Kitchen losartan (COZAAR) 50 MG tablet Take 1 tablet (50 mg total) by mouth daily.  . mesalamine (LIALDA) 1.2 g EC tablet Take 1.2 mg by mouth 4 (four) times daily.  . predniSONE (DELTASONE) 10 MG  tablet Take 10 mg by mouth daily.  . [DISCONTINUED] losartan (COZAAR) 50 MG tablet Take 1 tablet (50 mg total) by mouth daily. Please keep upcoming appt in September for future refills. Thank you     Allergies:   Patient has no known allergies.   Social History   Socioeconomic History  . Marital status: Divorced    Spouse name: Not on file  . Number of children: Not on file  . Years of education: Not on file  . Highest education level: Not on file  Occupational History  . Not on file  Social Needs  . Financial resource strain: Not on file  . Food insecurity    Worry: Not on file    Inability: Not on file  . Transportation needs    Medical: Not on file    Non-medical: Not on file  Tobacco Use  . Smoking status: Former Smoker    Packs/day: 10.00    Years: 1.00    Pack years: 10.00    Types: Cigarettes    Quit date: 07/28/2002    Years since quitting: 16.2  . Smokeless tobacco: Current User    Types: Snuff  Substance and Sexual Activity  . Alcohol use: Yes    Alcohol/week: 25.0 standard drinks    Types: 25 Cans of beer per week    Comment: daily  . Drug use: No  . Sexual activity: Not on file  Lifestyle  . Physical activity    Days per week: Not on file    Minutes per session: Not on file  . Stress: Not on file  Relationships  . Social Herbalist on phone: Not on file    Gets together: Not on file    Attends religious service: Not on file    Active member of club or organization: Not on file    Attends meetings of clubs or organizations: Not on file    Relationship status: Not on file  Other Topics Concern  . Not on file  Social History Narrative  . Not on file     Family History: The patient's family history includes Bradycardia in his father; Healthy in his mother and sister; Heart disease in his paternal grandfather; Other in his father. ROS:   Please see the history of present illness.     All other systems reviewed and are negative.   EKGs/Labs/Other Studies Reviewed:    The following studies were reviewed today:  Echocardiogram 05/25/2017 Study Conclusions - Left ventricle: The cavity size was normal. Wall thickness was   normal. Systolic function was normal. The estimated ejection   fraction was in the range of 55% to 60%. Wall motion was normal;   there were no regional wall motion abnormalities. Doppler   parameters are consistent with abnormal left ventricular   relaxation (grade 1 diastolic dysfunction). - Aortic valve: There was mild regurgitation. - Aortic root: The aortic root was mildly dilated.  Impressions: - Normal LV systolic function; mild diastolic dysfunction;   sclerotic aortic valve with mild AI; mildly dilated aortic  root   (4.3 cm).  EKG:  EKG is  ordered today.  The ekg ordered today demonstrates normal sinus rhythm, 77 bpm, no abnormalities  Recent Labs: 06/01/2018: ALT 12; BUN 5; Creatinine, Ser 0.92; Hemoglobin 13.1; Magnesium 2.1; Platelets 391; Potassium 3.2; Sodium 132   Recent Lipid Panel    Component Value Date/Time   CHOL 210 (H) 05/11/2017 0935   TRIG 128 05/11/2017 0935   HDL 54 05/11/2017 0935   CHOLHDL 3.9 05/11/2017 0935   LDLCALC 130 (H) 05/11/2017 0935    Physical Exam:    VS:  BP 130/80   Pulse 77   Ht  (1.676 m)   Wt 208 lb (94.3 kg)   SpO2 98%   BMI 33.57 kg/m     Wt Readings from Last 3 Encounters:  10/18/18 208 lb (94.3 kg)  06/12/17 189 lb (85.7 kg)  05/11/17 196 lb 6.4 oz (89.1 kg)     Physical Exam  Constitutional: He is oriented to person, place, and time. He appears well-developed and well-nourished. No distress.  HENT:  Head: Normocephalic and atraumatic.  Neck: Normal range of motion. Neck supple. No JVD present.  Cardiovascular: Normal rate, regular rhythm, normal heart sounds and intact distal pulses. Exam reveals no gallop and no friction rub.  No murmur heard. Pulmonary/Chest: Effort normal and breath sounds normal. No respiratory  distress. He has no wheezes. He has no rales.  Abdominal: Soft. Bowel sounds are normal.  Musculoskeletal: Normal range of motion.        General: No edema.  Neurological: He is alert and oriented to person, place, and time.  Skin: Skin is warm and dry.  Psychiatric: He has a normal mood and affect. His behavior is normal. Judgment and thought content normal.  Vitals reviewed.    ASSESSMENT:    1. Aortic root dilatation (HCC)   2. Lightheadedness   3. Thyroid nodule   4. Essential hypertension    PLAN:    In order of problems listed above:  Aortic dilatation -CTA 06/22/2017 4.3 cm with mild aortic regurgitation.  Trileaflet aortic valve.  Follow-up chest CTA 12/29/2017 with ascending aorta 4.1 cm, non-aneurysmal.  No evidence of aortic dissection. -We will follow annually, due in 12/2018.  This will be arranged. -Pt lives in [place] and we will arrange for follow-up in [place] for 12 months.  Lightheadedness/PVCs -No recent lightheadedness or palpitations.  Thyroid nodule -Found on chest CTA for evaluation of aortic aneurysm.  Follow-up ultrasound showed 2 category for nodules, 1 meets criteria for fine-needle biopsy, the other meets criteria for follow-up ultrasound in 1 year. [place] and Bx were done.   Medication Adjustments/Labs and Tests Ordered: Current medicines are reviewed at length with the patient today.  Concerns regarding medicines are outlined above. Labs and tests ordered and medication changes are outlined in the patient instructions below:  Patient Instructions  Medication Instructions:  Your physician recommends that you continue on your current medications as directed. Please refer to the Current Medication list given to you today.  If you need a refill on your cardiac medications before your next appointment, please call your pharmacy.   Lab work: FUTURE: BMET 1 week before your CTA  If you have labs (blood work) drawn today and your tests are completely  normal, you will receive your results only by: Marland Kitchen MyChart Message (if you have MyChart) OR . A paper copy in the mail If you have any lab test that is abnormal or we need  to change your treatment, we will call you to review the results.  Testing/Procedures: Your physician has ordered for you to have a CTA in December   Follow-Up: At [facility], you and your health needs are our priority.  As part of our continuing mission to provide you with exceptional heart care, we have created designated Provider Care Teams.  These Care Teams include your primary Cardiologist (physician) and Advanced Practice Providers (APPs -  Physician Assistants and Nurse Practitioners) who all work together to provide you with the care you need, when you need it. You will need a follow up appointment in 12 months.  Please call our office 2 months in advance to schedule this appointment.  You may see Dr. Dulce Sellar or another member of our [facility] Provider Team in [place]: Gypsy Balsam, MD . Belva Crome, MD  Any Other Special Instructions Will Be Listed Below (If Applicable).     Signed, Berton Bon, NP  10/18/2018 1:40 PM    [facility]

## 2018-10-24 ENCOUNTER — Telehealth: Payer: Self-pay | Admitting: *Deleted

## 2018-10-24 NOTE — Telephone Encounter (Signed)
Called pt to schedule CT for Dec LMTCB to [phone]

## 2018-12-26 ENCOUNTER — Telehealth: Payer: Self-pay | Admitting: Cardiology

## 2018-12-26 DIAGNOSIS — I10 Essential (primary) hypertension: Secondary | ICD-10-CM

## 2018-12-26 NOTE — Telephone Encounter (Signed)
I will route to Tyreka C. CMA for Pecolia Ades, NP who ordered bmet to be done 1 week before CTA.

## 2018-12-26 NOTE — Telephone Encounter (Signed)
Pt will have Bmet done in [place]. Order is in

## 2018-12-26 NOTE — Telephone Encounter (Signed)
Follow up     Pts mom is calling to follow up from the call this morning. She says the pt works third shift and is asleep They have been waiting to know if he can get his labs in [place]     Please call

## 2018-12-26 NOTE — Telephone Encounter (Signed)
New Message:     Pt says he needs lab work  This week. His question is, since he lives in [place], can he go to our office in [place] and have it there?

## 2018-12-28 ENCOUNTER — Other Ambulatory Visit: Payer: Self-pay | Admitting: *Deleted

## 2018-12-28 DIAGNOSIS — I10 Essential (primary) hypertension: Secondary | ICD-10-CM

## 2018-12-29 LAB — BASIC METABOLIC PANEL WITH GFR
BUN/Creatinine Ratio: 11 (ref 9–20)
BUN: 10 mg/dL (ref 6–24)
CO2: 24 mmol/L (ref 20–29)
Calcium: 9.6 mg/dL (ref 8.7–10.2)
Chloride: 101 mmol/L (ref 96–106)
Creatinine, Ser: 0.89 mg/dL (ref 0.76–1.27)
GFR calc Af Amer: 120 mL/min/1.73 (ref >59)
GFR calc non Af Amer: 104 mL/min/1.73 (ref >59)
Glucose: 99 mg/dL (ref 65–99)
Potassium: 4.3 mmol/L (ref 3.5–5.2)
Sodium: 141 mmol/L (ref 134–144)

## 2019-01-02 ENCOUNTER — Other Ambulatory Visit: Payer: Self-pay

## 2019-01-02 ENCOUNTER — Ambulatory Visit (INDEPENDENT_AMBULATORY_CARE_PROVIDER_SITE_OTHER)
Admission: RE | Admit: 2019-01-02 | Discharge: 2019-01-02 | Disposition: A | Discharge status: Home / Self Care | Payer: Managed Care, Other (non HMO) | Source: Ambulatory Visit | Attending: Cardiology | Admitting: Cardiology

## 2019-01-02 DIAGNOSIS — I77810 Thoracic aortic ectasia: Secondary | ICD-10-CM | POA: Diagnosis not present

## 2019-01-02 MED ORDER — IOHEXOL 350 MG/ML SOLN
100.0000 mL | Freq: Once | INTRAVENOUS | Status: AC | PRN
  Administered 2019-01-02: 10:00:00 100 mL via INTRAVENOUS

## 2019-01-25 IMAGING — CT CT HEAD W/O CM
4 series · 17 of 47 positions shown, 19 images · non-contrast
Comparison: None.

CLINICAL DATA: Sudden onset of dizziness with nausea.

EXAM:
CT HEAD WITHOUT CONTRAST
TECHNIQUE: Contiguous axial images were obtained from the base of the skull
through the vertex without intravenous contrast.

[Series 3: head without · axial · non-contrast · 0.45mm/px · z∈[-83,+37]mm · 7 of 33 slices shown, 9 images]
[im 5/33  brain]
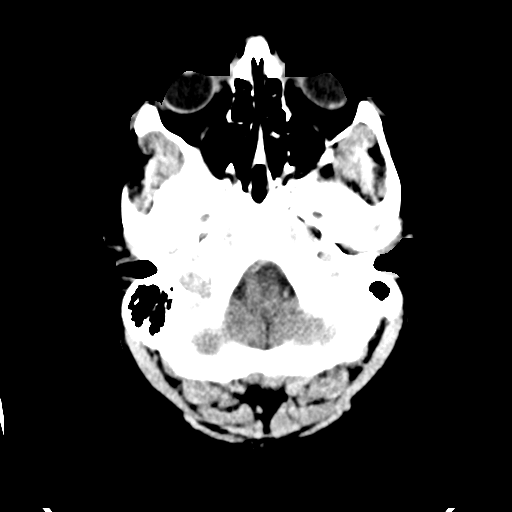
[im 5/33  bone]
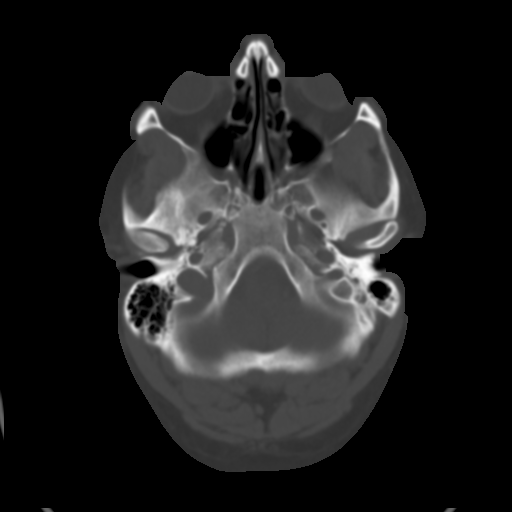
[im 9/33  brain]
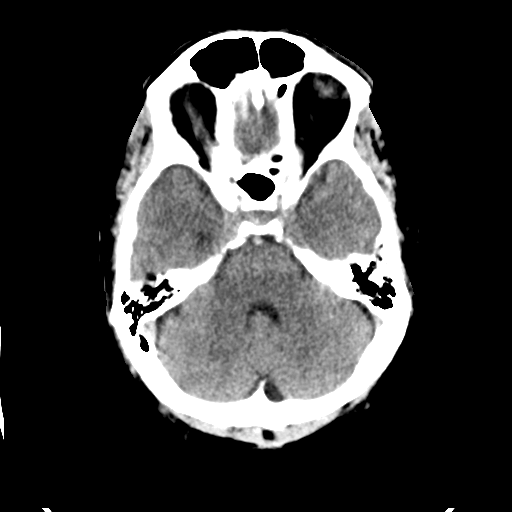
[im 13/33  brain]
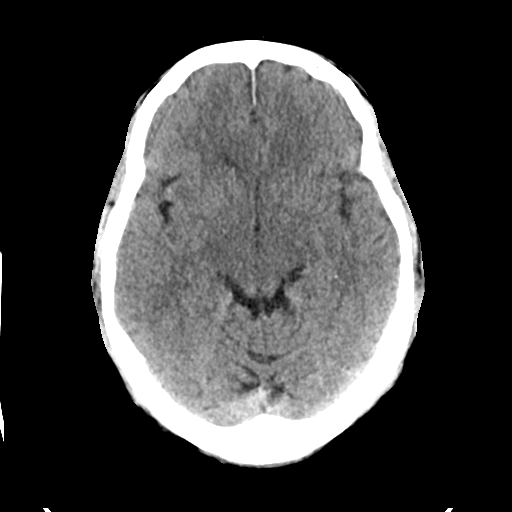
[im 17/33  brain]
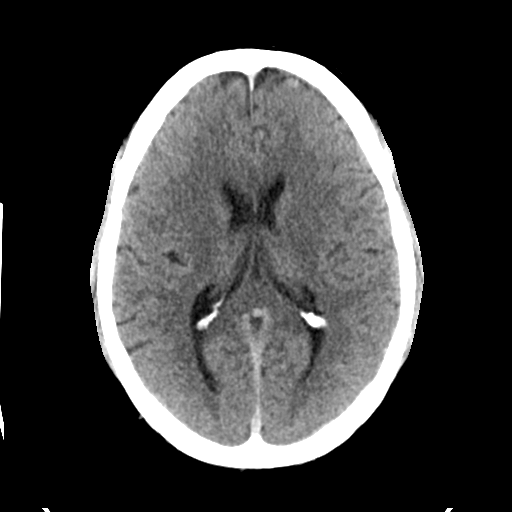
[im 21/33  brain]
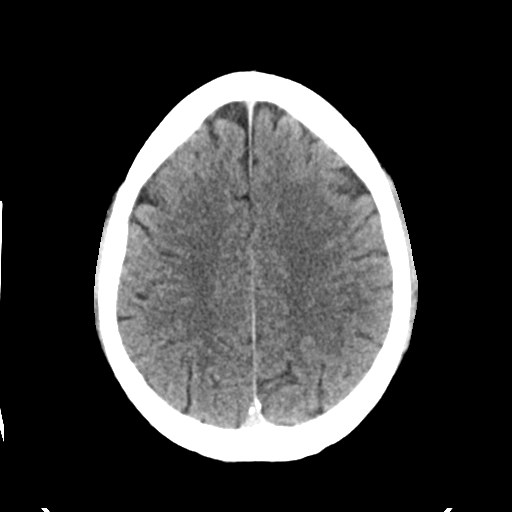
[im 21/33  bone]
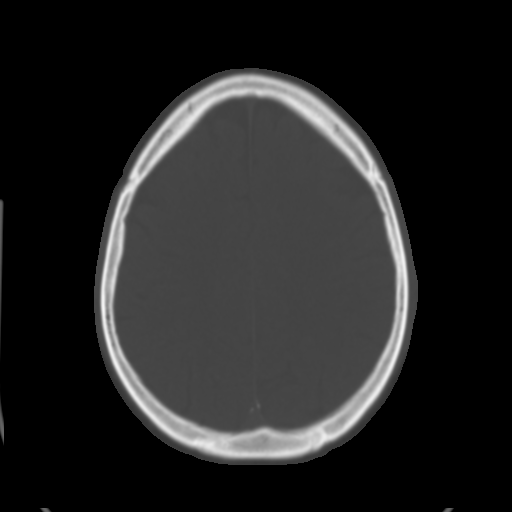
[im 25/33  brain]
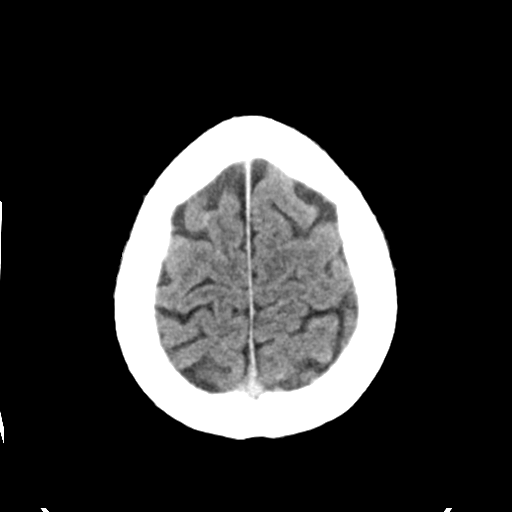
[im 29/33  brain]
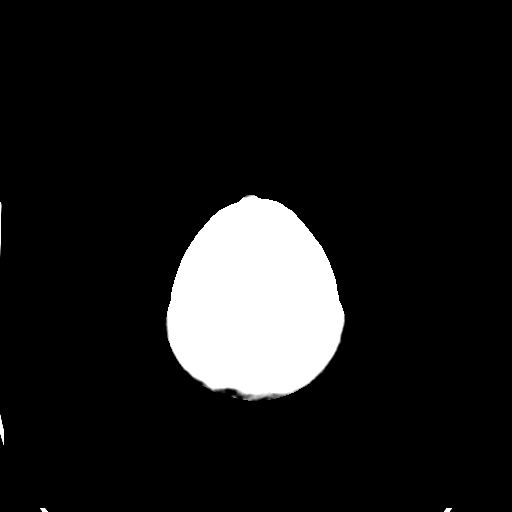

[Series 4: head bone · axial · 0.45mm/px · z∈[-87,-31]mm · 4 of 82 slices shown]
[im 9/82  bone]
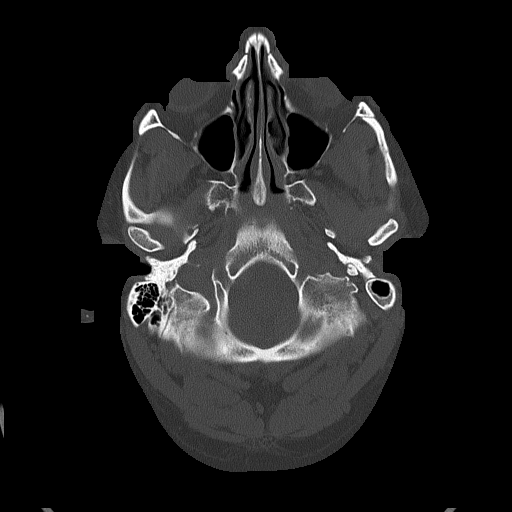
[im 17/82  bone]
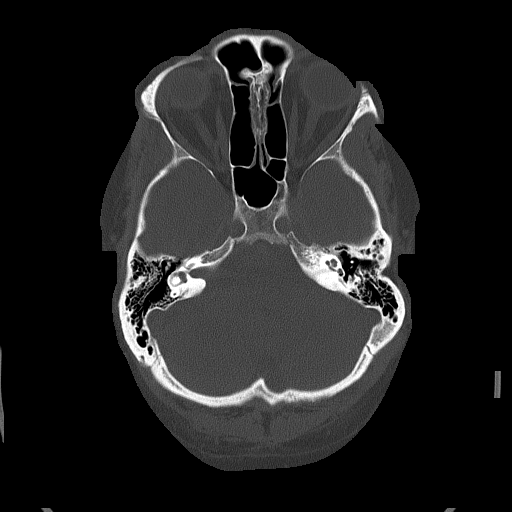
[im 25/82  bone]
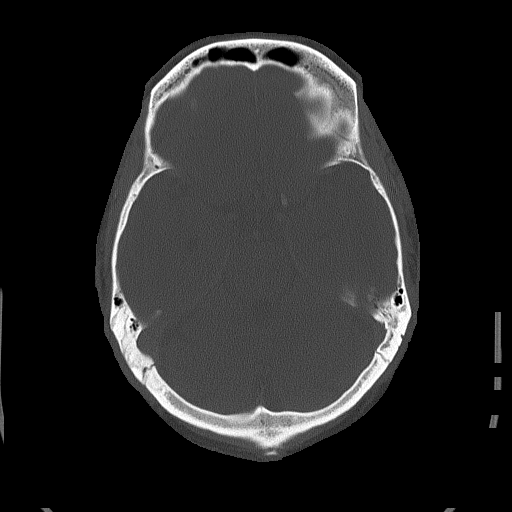
[im 37/82  bone]
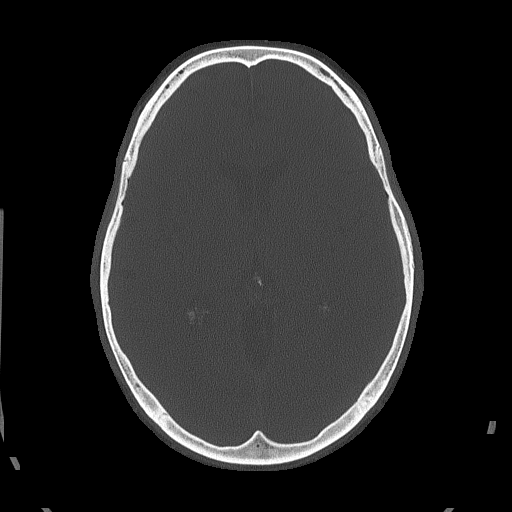

[Series 5: head without cor · coronal · non-contrast · 0.32mm/px · 3 of 72 slices shown]
[im 24/72  brain]
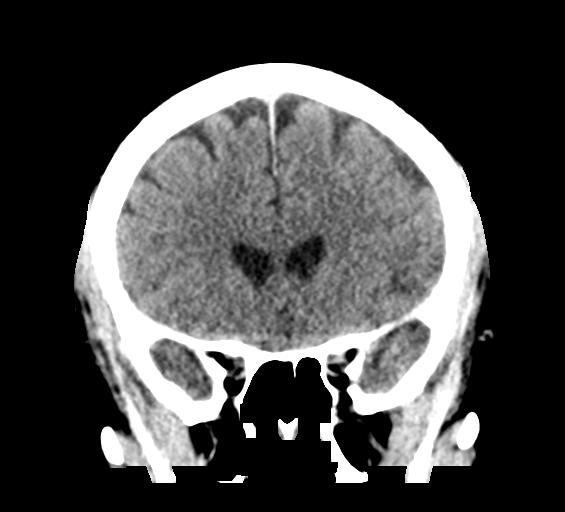
[im 32/72  brain]
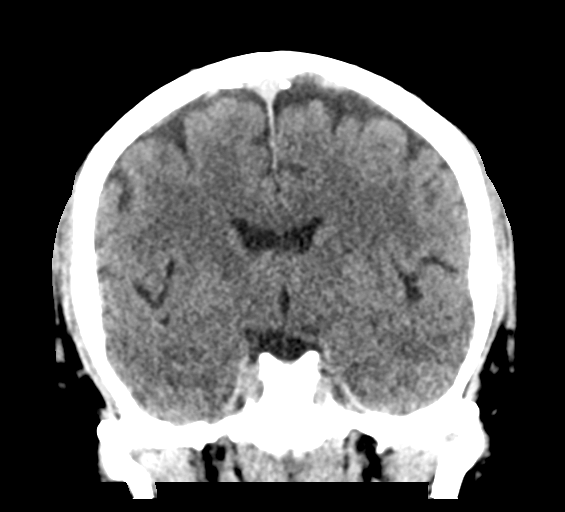
[im 40/72  brain]
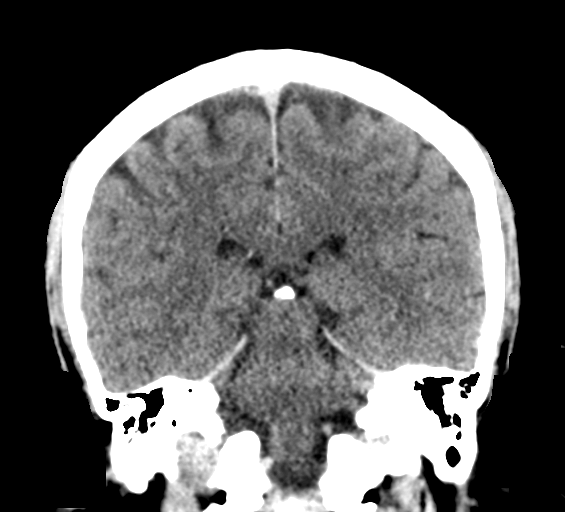

[Series 6: head without sag · sagittal · non-contrast · 0.35mm/px · 3 of 67 slices shown]
[im 23/67  brain]
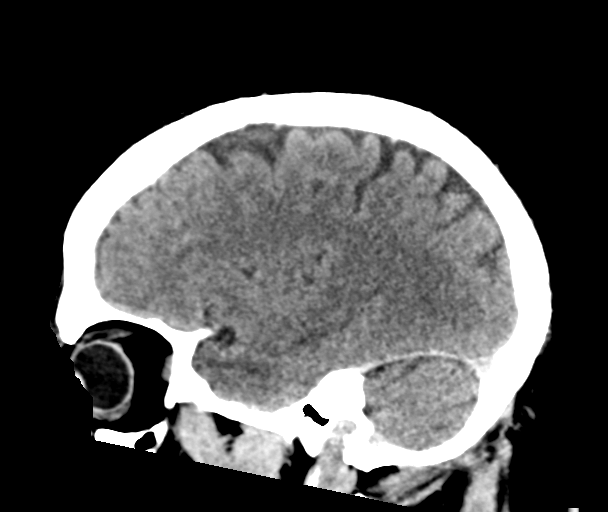
[im 34/67  brain]
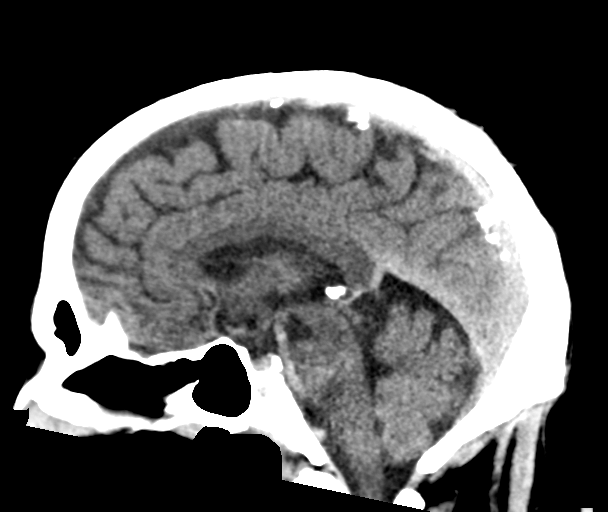
[im 45/67  brain]
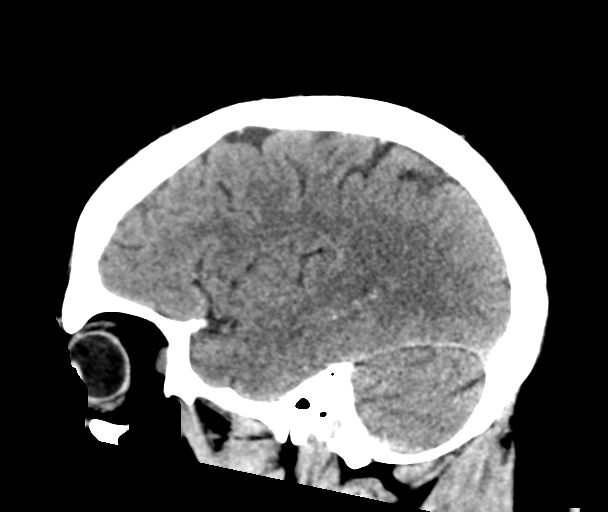

[17 of 47 positions shown; findings below may reference images not displayed]

FINDINGS: Brain: No evidence of acute infarction, hemorrhage, hydrocephalus,
extra-axial collection or mass lesion/mass effect.

Vascular: No hyperdense vessel or unexpected calcification.

Skull: Normal. Negative for fracture or focal lesion.

Sinuses/Orbits: Globes and orbits are unremarkable. Visualized
sinuses and mastoid air cells are clear.

Other: None.
IMPRESSION: Normal unenhanced CT scan of the brain.

## 2019-10-11 ENCOUNTER — Telehealth: Payer: Self-pay | Admitting: Cardiology

## 2019-10-11 ENCOUNTER — Other Ambulatory Visit: Payer: Self-pay

## 2019-10-11 DIAGNOSIS — I10 Essential (primary) hypertension: Secondary | ICD-10-CM

## 2019-10-11 LAB — BASIC METABOLIC PANEL
BUN/Creatinine Ratio: 13 (ref 9–20)
BUN: 12 mg/dL (ref 6–24)
CO2: 24 mmol/L (ref 20–29)
Calcium: 10.2 mg/dL (ref 8.7–10.2)
Chloride: 99 mmol/L (ref 96–106)
Creatinine, Ser: 0.95 mg/dL (ref 0.76–1.27)
GFR calc Af Amer: 111 mL/min/{1.73_m2} (ref >59)
GFR calc non Af Amer: 96 mL/min/{1.73_m2} (ref >59)
Glucose: 99 mg/dL (ref 65–99)
Potassium: 4 mmol/L (ref 3.5–5.2)
Sodium: 139 mmol/L (ref 134–144)

## 2019-10-11 MED ORDER — LOSARTAN POTASSIUM 50 MG PO TABS: 50.0000 mg | ORAL_TABLET | Freq: Every day | ORAL | 0 refills | Status: DC

## 2019-10-11 NOTE — Telephone Encounter (Signed)
Refill sent in per request.  

## 2019-10-11 NOTE — Telephone Encounter (Signed)
Patient stopped by for labs today. He stated he is out of his medication- Losartan. He has an appt with Dr. Servando Salina on Wednesday, 10/16/19. Would like enough meds to get him through until his appt.

## 2019-10-14 DIAGNOSIS — I10 Essential (primary) hypertension: Secondary | ICD-10-CM | POA: Insufficient documentation

## 2019-10-16 ENCOUNTER — Other Ambulatory Visit: Payer: Self-pay

## 2019-10-16 ENCOUNTER — Other Ambulatory Visit: Payer: Self-pay | Admitting: *Deleted

## 2019-10-16 ENCOUNTER — Encounter: Payer: Self-pay | Admitting: Cardiology

## 2019-10-16 ENCOUNTER — Ambulatory Visit: Payer: BC Managed Care – PPO | Admitting: Cardiology

## 2019-10-16 VITALS — BP 118/80 | HR 70 | Ht 65.5 in | Wt 209.8 lb

## 2019-10-16 DIAGNOSIS — I10 Essential (primary) hypertension: Secondary | ICD-10-CM | POA: Diagnosis not present

## 2019-10-16 DIAGNOSIS — R072 Precordial pain: Secondary | ICD-10-CM | POA: Diagnosis not present

## 2019-10-16 DIAGNOSIS — E669 Obesity, unspecified: Secondary | ICD-10-CM | POA: Insufficient documentation

## 2019-10-16 DIAGNOSIS — I77810 Thoracic aortic ectasia: Secondary | ICD-10-CM

## 2019-10-16 DIAGNOSIS — E782 Mixed hyperlipidemia: Secondary | ICD-10-CM | POA: Diagnosis not present

## 2019-10-16 DIAGNOSIS — I200 Unstable angina: Secondary | ICD-10-CM

## 2019-10-16 HISTORY — DX: Precordial pain: R07.2

## 2019-10-16 HISTORY — DX: Obesity, unspecified: E66.9

## 2019-10-16 LAB — LIPID PANEL
Chol/HDL Ratio: 3.7 ratio (ref 0.0–5.0)
Cholesterol, Total: 199 mg/dL (ref 100–199)
HDL: 54 mg/dL (ref >39)
LDL Chol Calc (NIH): 123 mg/dL — ABNORMAL HIGH (ref 0–99)
Triglycerides: 121 mg/dL (ref 0–149)
VLDL Cholesterol Cal: 22 mg/dL (ref 5–40)

## 2019-10-16 MED ORDER — LOSARTAN POTASSIUM 50 MG PO TABS: 50.0000 mg | ORAL_TABLET | Freq: Every day | ORAL | 3 refills | Status: DC

## 2019-10-16 MED ORDER — METOPROLOL TARTRATE 100 MG PO TABS: ORAL_TABLET | ORAL | 0 refills | Status: DC

## 2019-10-16 NOTE — Patient Instructions (Signed)
Medication Instructions:  Your physician recommends that you continue on your current medications as directed. Please refer to the Current Medication list given to you today.  *If you need a refill on your cardiac medications before your next appointment, please call your pharmacy*   Lab Work: TODAY:  LIPID  When they call you to schedule your Cardiac CT, you will need to come to the office for a BMET.    If you have labs (blood work) drawn today and your tests are completely normal, you will receive your results only by: Marland Kitchen MyChart Message (if you have MyChart) OR . A paper copy in the mail If you have any lab test that is abnormal or we need to change your treatment, we will call you to review the results.   Testing/Procedures: Non-Cardiac CT Angiography (CTA), is a special type of CT scan that uses a computer to produce multi-dimensional views of major blood vessels throughout the body. In CT angiography, a contrast material is injected through an IV to help visualize the blood vessels   Your physician has requested that you have cardiac CT. Cardiac computed tomography (CT) is a painless test that uses an x-ray machine to take clear, detailed pictures of your heart. For further information please visit [email or web address]. Please follow instruction sheet BELOW:  Your cardiac CT will be scheduled at one of the below locations:   [facility] [address] [place], [place] [address] [phone]  If scheduled at [facility], please arrive at the [facility] main entrance of [facility] 30 minutes prior to test start time. Proceed to the [facility] Radiology Department (first floor) to check-in and test prep.  Please follow these instructions carefully (unless otherwise directed):  Hold all erectile dysfunction medications at least 3 days (72 hrs) prior to test.  On the Night Before the Test: . Be sure to Drink plenty of water. . Do not consume any  caffeinated/decaffeinated beverages or chocolate 12 hours prior to your test. . Do not take any antihistamines 12 hours prior to your test.   On the Day of the Test: . Drink plenty of water. Do not drink any water within one hour of the test. . Do not eat any food 4 hours prior to the test. . You may take your regular medications prior to the test.  . Take metoprolol (Lopressor) two hours prior to test. This has been sent to your pharmacy.        After the Test: . Drink plenty of water. . After receiving IV contrast, you may experience a mild flushed feeling. This is normal. . On occasion, you may experience a mild rash up to 24 hours after the test. This is not dangerous. If this occurs, you can take Benadryl 25 mg and increase your fluid intake. . If you experience trouble breathing, this can be serious. If it is severe call 911 IMMEDIATELY. If it is mild, please call our office. . If you take any of these medications: Glipizide/Metformin, Avandament, Glucavance, please do not take 48 hours after completing test unless otherwise instructed.   Once we have confirmed authorization from your insurance company, we will call you to set up a date and time for your test. Based on how quickly your insurance processes prior authorizations requests, please allow up to 4 weeks to be contacted for scheduling your Cardiac CT appointment. Be advised that routine Cardiac CT appointments could be scheduled as many as 8 weeks after  your provider has ordered it.  For non-scheduling related questions, please contact the cardiac imaging nurse navigator should you have any questions/concerns: Marchia Bond, Cardiac Imaging Nurse Navigator Burley Saver, Interim Cardiac Imaging Nurse Navigator [facility] Direct Office Dial: [phone]   For scheduling needs, including cancellations and rescheduling, please call Vivien Rota at [phone], option 3.   .      Follow-Up: At [facility], you and your health needs are our priority.  As part of our continuing mission to provide you with exceptional heart care, we have created designated Provider Care Teams.  These Care Teams include your primary Cardiologist (physician) and Advanced Practice Providers (APPs -  Physician Assistants and Nurse Practitioners) who all work together to provide you with the care you need, when you need it.  We recommend signing up for the patient portal called "MyChart".  Sign up information is provided on this After Visit Summary.  MyChart is used to connect with patients for Virtual Visits (Telemedicine).  Patients are able to view lab/test results, encounter notes, upcoming appointments, etc.  Non-urgent messages can be sent to your provider as well.   To learn more about what you can do with MyChart, go to [email or web address].    Your next appointment:   3 month(s)  The format for your next appointment:   In Person  Provider:   Berniece Salines, DO   Other Instructions

## 2019-10-16 NOTE — Progress Notes (Signed)
Cardiology Office Note:    Date:  10/16/2019   ID:  Ricardo Odom, DOB 1975-05-02, MRN [id number]  PCP:  Ronita Hipps, MD  Cardiologist:  Nelva Bush, MD  Electrophysiologist:  None   Referring MD: Ronita Hipps, MD   Follow up visit  History of Present Illness:    Ricardo Odom is a 44 y.o. male with a hx of hypertension, former smoker, aortic root dilatation, PVCs presents today for follow-up visit.  The patient is transferring care to our hospital heart care office.  He was last seen by Charlotta Newton, NP in September 2020 at that time he appears to be doing well from a cardiovascular standpoint and requested to start cardiac care to the [facility] office.  Today he tells me he has been doing well.  He notes that his lightheadedness and palpitation has improved significantly he has not had any.  However he does report that he has been experiencing intermittent left-sided chest pain that he is concerned about.  He notes that is sometimes a burning sensation and sometimes is a tightness.  It last for few minutes and then resolve itself.  It self-limiting and he does not have to take any medication for this.  The frequency is increasing and on this is now concerning.  He denies any shortness of breath, or any radiation of this pain.  Past Medical History:  Diagnosis Date  . Aortic root dilatation ([place]) 06/11/2017  . Essential hypertension 05/12/2017  . Hyperlipidemia 06/11/2017  . Hypertension   . Lightheadedness 05/12/2017  . Palpitations 05/12/2017  . Postoperative examination 04/13/2015  . PVC's (premature ventricular contractions) 05/12/2017    Past Surgical History:  Procedure Laterality Date  . APPENDECTOMY    . THROAT SURGERY      Current Medications: Current Meds  Medication Sig  . losartan (COZAAR) 50 MG tablet Take 1 tablet (50 mg total) by mouth daily.  . mesalamine (LIALDA) 1.2 g EC tablet Take 1.2 mg by mouth 4 (four) times daily.  . [DISCONTINUED]  losartan (COZAAR) 50 MG tablet Take 1 tablet (50 mg total) by mouth daily.     Allergies:   Patient has no known allergies.   Social History   Socioeconomic History  . Marital status: Divorced    Spouse name: Not on file  . Number of children: Not on file  . Years of education: Not on file  . Highest education level: Not on file  Occupational History  . Not on file  Tobacco Use  . Smoking status: Former Smoker    Packs/day: 10.00    Years: 1.00    Pack years: 10.00    Types: Cigarettes    Quit date: 07/28/2002    Years since quitting: 17.2  . Smokeless tobacco: Current User    Types: Snuff  Substance and Sexual Activity  . Alcohol use: Yes    Alcohol/week: 25.0 standard drinks    Types: 25 Cans of beer per week    Comment: daily  . Drug use: No  . Sexual activity: Not on file  Other Topics Concern  . Not on file  Social History Narrative  . Not on file   Social Determinants of Health   Financial Resource Strain:   . Difficulty of Paying Living Expenses: Not on file  Food Insecurity:   . Worried About Charity fundraiser in the Last Year: Not on file  . Ran Out of Food in the Last Year: Not on file  Transportation Needs:   . Film/video editor (Medical): Not on file  . Lack of Transportation (Non-Medical): Not on file  Physical Activity:   . Days of Exercise per Week: Not on file  . Minutes of Exercise per Session: Not on file  Stress:   . Feeling of Stress : Not on file  Social Connections:   . Frequency of Communication with Friends and Family: Not on file  . Frequency of Social Gatherings with Friends and Family: Not on file  . Attends Religious Services: Not on file  . Active Member of Clubs or Organizations: Not on file  . Attends Archivist Meetings: Not on file  . Marital Status: Not on file     Family History: The patient's family history includes Bradycardia in his father; Healthy in his mother and sister; Heart disease in his paternal  grandfather; Other in his father.  ROS:   Review of Systems  Constitution: Negative for decreased appetite, fever and weight gain.  HENT: Negative for congestion, ear discharge, hoarse voice and sore throat.   Eyes: Negative for discharge, redness, vision loss in right eye and visual halos.  Cardiovascular: Negative for chest pain, dyspnea on exertion, leg swelling, orthopnea and palpitations.  Respiratory: Negative for cough, hemoptysis, shortness of breath and snoring.   Endocrine: Negative for heat intolerance and polyphagia.  Hematologic/Lymphatic: Negative for bleeding problem. Does not bruise/bleed easily.  Skin: Negative for flushing, nail changes, rash and suspicious lesions.  Musculoskeletal: Negative for arthritis, joint pain, muscle cramps, myalgias, neck pain and stiffness.  Gastrointestinal: Negative for abdominal pain, bowel incontinence, diarrhea and excessive appetite.  Genitourinary: Negative for decreased libido, genital sores and incomplete emptying.  Neurological: Negative for brief paralysis, focal weakness, headaches and loss of balance.  Psychiatric/Behavioral: Negative for altered mental status, depression and suicidal ideas.  Allergic/Immunologic: Negative for HIV exposure and persistent infections.    EKGs/Labs/Other Studies Reviewed:    The following studies were reviewed today:   EKG:  The ekg ordered today demonstrates sinus rhythm, heart rate 70/min, compared to prior EKG no significant change.  CTA of the chest 12/29/2017 IMPRESSION: -Maximal diameter of the ascending aorta at the sinus of Valsalva is 4.1 cm compared with 4.4 cm on the prior study. The ascending aorta is nonaneurysmal. -1.4 cm left thyroid nodule is better visualized today. Thyroid ultrasound is recommended. -Right nephrolithiasis. -Aortic Atherosclerosis (ICD10-I70.0).  CTA of the chest 06/22/2017 IMPRESSION: -Maximal diameter of the aorta at the sinus of Valsalva is 4.4 cm.  At the sinotubular junction it is 3.4 cm. The ascending aorta is nonaneurysmal. -Aortic Atherosclerosis (ICD10-I70.0).  Echocardiogram 05/25/2017 Study Conclusions - Left ventricle: The cavity size was normal. Wall thickness was normal. Systolic function was normal. The estimated ejection fraction was in the range of 55% to 60%. Wall motion was normal; there were no regional wall motion abnormalities. Doppler parameters are consistent with abnormal left ventricular relaxation (grade 1 diastolic dysfunction). - Aortic valve: There was mild regurgitation. - Aortic root: The aortic root was mildly dilated.  Impressions: - Normal LV systolic function; mild diastolic dysfunction; sclerotic aortic valve with mild AI; mildly dilated aortic root (4.3 cm).    Recent Labs: 10/11/2019: BUN 12; Creatinine, Ser 0.95; Potassium 4.0; Sodium 139  Recent Lipid Panel    Component Value Date/Time   CHOL 210 (H) 05/11/2017 0935   TRIG 128 05/11/2017 0935   HDL 54 05/11/2017 0935   CHOLHDL 3.9 05/11/2017 0935   LDLCALC 130 (H) 05/11/2017 0935  Physical Exam:    VS:  BP 118/80   Pulse 70   Ht 5' 5.5" (1.664 m)   Wt 209 lb 12.8 oz (95.2 kg)   BMI 34.38 kg/m     Wt Readings from Last 3 Encounters:  10/16/19 209 lb 12.8 oz (95.2 kg)  10/18/18 208 lb (94.3 kg)  06/12/17 189 lb (85.7 kg)     GEN: Well nourished, well developed in no acute distress HEENT: Normal NECK: No JVD; No carotid bruits LYMPHATICS: No lymphadenopathy CARDIAC: S1S2 noted,RRR, no murmurs, rubs, gallops RESPIRATORY:  Clear to auscultation without rales, wheezing or rhonchi  ABDOMEN: Soft, non-tender, non-distended, +bowel sounds, no guarding. EXTREMITIES: No edema, No cyanosis, no clubbing MUSCULOSKELETAL:  No deformity  SKIN: Warm and dry NEUROLOGIC:  Alert and oriented x 3, non-focal PSYCHIATRIC:  Normal affect, good insight  ASSESSMENT:    1. Precordial pain   2. Essential hypertension    3. Aortic root dilatation (HCC)   4. Mixed hyperlipidemia   5. Obesity (BMI 30-39.9)    PLAN:     His chest pain is concerning I like to pursue an ischemic evaluation in this patient.  We have discussed a coronary CTA he is agreeable for this.  He has no IV contrast dye allergy.  All of his questions has been answered.  In addition he is due for his chest CTA due to his dilated ascending aorta.  I am hoping he can be able to get both done on the same day.  In addition his previous lipid profile showed LDL of 130 he has not had any repeats he is fasting today we will go to get this blood work done.  Because if this is still 130 or higher he will need to be on a statin medication.  Continue his losartan 50 mg daily.  His blood pressure is acceptable in the office.  The patient understands the need to lose weight with diet and exercise. We have discussed specific strategies for this.  The patient is in agreement with the above plan. The patient left the office in stable condition.  The patient will follow up in 3 months or sooner if needed.   Medication Adjustments/Labs and Tests Ordered: Current medicines are reviewed at length with the patient today.  Concerns regarding medicines are outlined above.  Orders Placed This Encounter  Procedures  . CT ANGIO CHEST AORTA W/CM & OR WO/CM  . CT CORONARY MORPH W/CTA COR W/SCORE W/CA W/CM &/OR WO/CM  . CT CORONARY FRACTIONAL FLOW RESERVE DATA PREP  . CT CORONARY FRACTIONAL FLOW RESERVE FLUID ANALYSIS  . Lipid panel  . Basic metabolic panel  . EKG 12-Lead   Meds ordered this encounter  Medications  . metoprolol tartrate (LOPRESSOR) 100 MG tablet    Sig: Take 1 tablet by mouth 2 hours prior to your cardiac ct    Dispense:  1 tablet    Refill:  0  . losartan (COZAAR) 50 MG tablet    Sig: Take 1 tablet (50 mg total) by mouth daily.    Dispense:  90 tablet    Refill:  3    Patient Instructions  Medication Instructions:  Your physician  recommends that you continue on your current medications as directed. Please refer to the Current Medication list given to you today.  *If you need a refill on your cardiac medications before your next appointment, please call your pharmacy*   Lab Work: TODAY:  LIPID  When they call you to  schedule your Cardiac CT, you will need to come to the office for a BMET.    If you have labs (blood work) drawn today and your tests are completely normal, you will receive your results only by: Marland Kitchen MyChart Message (if you have MyChart) OR . A paper copy in the mail If you have any lab test that is abnormal or we need to change your treatment, we will call you to review the results.   Testing/Procedures: Non-Cardiac CT Angiography (CTA), is a special type of CT scan that uses a computer to produce multi-dimensional views of major blood vessels throughout the body. In CT angiography, a contrast material is injected through an IV to help visualize the blood vessels   Your physician has requested that you have cardiac CT. Cardiac computed tomography (CT) is a painless test that uses an x-ray machine to take clear, detailed pictures of your heart. For further information please visit [email or web address]. Please follow instruction sheet BELOW:  Your cardiac CT will be scheduled at one of the below locations:   [facility] [address] [place], [place] [address] [phone]  If scheduled at [facility], please arrive at the [facility] main entrance of [facility] 30 minutes prior to test start time. Proceed to the [facility] Department (first floor) to check-in and test prep.  Please follow these instructions carefully (unless otherwise directed):  Hold all erectile dysfunction medications at least 3 days (72 hrs) prior to test.  On the Night Before the Test: . Be sure to Drink plenty of water. . Do not consume any caffeinated/decaffeinated beverages or  chocolate 12 hours prior to your test. . Do not take any antihistamines 12 hours prior to your test.   On the Day of the Test: . Drink plenty of water. Do not drink any water within one hour of the test. . Do not eat any food 4 hours prior to the test. . You may take your regular medications prior to the test.  . Take metoprolol (Lopressor) two hours prior to test. This has been sent to your pharmacy.        After the Test: . Drink plenty of water. . After receiving IV contrast, you may experience a mild flushed feeling. This is normal. . On occasion, you may experience a mild rash up to 24 hours after the test. This is not dangerous. If this occurs, you can take Benadryl 25 mg and increase your fluid intake. . If you experience trouble breathing, this can be serious. If it is severe call 911 IMMEDIATELY. If it is mild, please call our office. . If you take any of these medications: Glipizide/Metformin, Avandament, Glucavance, please do not take 48 hours after completing test unless otherwise instructed.   Once we have confirmed authorization from your insurance company, we will call you to set up a date and time for your test. Based on how quickly your insurance processes prior authorizations requests, please allow up to 4 weeks to be contacted for scheduling your Cardiac CT appointment. Be advised that routine Cardiac CT appointments could be scheduled as many as 8 weeks after your provider has ordered it.  For non-scheduling related questions, please contact the cardiac imaging nurse navigator should you have any questions/concerns: Marchia Bond, Cardiac Imaging Nurse Navigator Burley Saver, Interim Cardiac Imaging Nurse [place] [facility] Direct Office Dial: [phone]   For scheduling needs, including cancellations and rescheduling, please call Vivien Rota  at [phone], option 3.   .      Follow-Up: At [facility], you and your health needs are  our priority.  As part of our continuing mission to provide you with exceptional heart care, we have created designated Provider Care Teams.  These Care Teams include your primary Cardiologist (physician) and Advanced Practice Providers (APPs -  Physician Assistants and Nurse Practitioners) who all work together to provide you with the care you need, when you need it.  We recommend signing up for the patient portal called "MyChart".  Sign up information is provided on this After Visit Summary.  MyChart is used to connect with patients for Virtual Visits (Telemedicine).  Patients are able to view lab/test results, encounter notes, upcoming appointments, etc.  Non-urgent messages can be sent to your provider as well.   To learn more about what you can do with MyChart, go to [email or web address].    Your next appointment:   3 month(s)  The format for your next appointment:   In Person  Provider:   Berniece Salines, DO   Other Instructions      Adopting a Healthy Lifestyle.  Know what a healthy weight is for you (roughly BMI <25) and aim to maintain this   Aim for 7+ servings of fruits and vegetables daily   65-80+ fluid ounces of water or unsweet tea for healthy kidneys   Limit to max 1 drink of alcohol per day; avoid smoking/tobacco   Limit animal fats in diet for cholesterol and heart health - choose grass fed whenever available   Avoid highly processed foods, and foods high in saturated/trans fats   Aim for low stress - take time to unwind and care for your mental health   Aim for 150 min of moderate intensity exercise weekly for heart health, and weights twice weekly for bone health   Aim for 7-9 hours of sleep daily   When it comes to diets, agreement about the perfect plan isnt easy to find, even among the experts. Experts at the [employer] developed an idea known as the Healthy Eating Plate. Just imagine a plate divided into logical, healthy  portions.   The emphasis is on diet quality:   Load up on vegetables and fruits - one-half of your plate: Aim for color and variety, and remember that potatoes dont count.   Go for whole grains - one-quarter of your plate: Whole wheat, barley, wheat berries, quinoa, oats, brown rice, and foods made with them. If you want pasta, go with whole wheat pasta.   Protein power - one-quarter of your plate: Fish, chicken, beans, and nuts are all healthy, versatile protein sources. Limit red meat.   The diet, however, does go beyond the plate, offering a few other suggestions.   Use healthy plant oils, such as olive, canola, soy, corn, sunflower and peanut. Check the labels, and avoid partially hydrogenated oil, which have unhealthy trans fats.   If youre thirsty, drink water. Coffee and tea are good in moderation, but skip sugary drinks and limit milk and dairy products to one or two daily servings.   The type of carbohydrate in the diet is more important than the amount. Some sources of carbohydrates, such as vegetables, fruits, whole grains, and beans-are healthier than others.   Finally, stay active  Signed, Berniece Salines, DO  10/16/2019 9:27 AM    [facility]

## 2019-10-21 ENCOUNTER — Telehealth: Payer: Self-pay

## 2019-10-21 NOTE — Telephone Encounter (Signed)
Spoke with patient regarding results and recommendation.  Patient verbalizes understanding and is agreeable to plan of care. Advised patient to call back with any issues or concerns.  

## 2019-10-21 NOTE — Telephone Encounter (Signed)
Left message on patients voicemail to please return our call.   

## 2019-10-21 NOTE — Telephone Encounter (Signed)
-----   Message from Kardie Tobb, DO sent at 10/17/2019  9:38 AM EDT ----- Your LDL is slightly elevated, otherwise triglyceride HDL and total cholesterol are within normal.  For now continue with diet modification. 

## 2019-10-21 NOTE — Telephone Encounter (Signed)
-----   Message from Thomasene Ripple, DO sent at 10/17/2019  9:38 AM EDT ----- Your LDL is slightly elevated, otherwise triglyceride HDL and total cholesterol are within normal.  For now continue with diet modification.

## 2019-11-08 ENCOUNTER — Ambulatory Visit (INDEPENDENT_AMBULATORY_CARE_PROVIDER_SITE_OTHER)
Admission: RE | Admit: 2019-11-08 | Discharge: 2019-11-08 | Disposition: A | Discharge status: Home / Self Care | Payer: BC Managed Care – PPO | Source: Ambulatory Visit | Attending: Cardiology | Admitting: Cardiology

## 2019-11-08 ENCOUNTER — Other Ambulatory Visit: Payer: Self-pay

## 2019-11-08 DIAGNOSIS — I77810 Thoracic aortic ectasia: Secondary | ICD-10-CM

## 2019-11-08 DIAGNOSIS — K449 Diaphragmatic hernia without obstruction or gangrene: Secondary | ICD-10-CM | POA: Diagnosis not present

## 2019-11-08 MED ORDER — IOHEXOL 350 MG/ML SOLN
100.0000 mL | Freq: Once | INTRAVENOUS | Status: AC | PRN
  Administered 2019-11-08: 100 mL via INTRAVENOUS

## 2019-11-11 ENCOUNTER — Telehealth: Payer: Self-pay

## 2019-11-11 NOTE — Telephone Encounter (Signed)
Spoke with patient regarding results and recommendation.  Patient verbalizes understanding and is agreeable to plan of care. Advised patient to call back with any issues or concerns.  

## 2019-11-11 NOTE — Telephone Encounter (Signed)
-----   Message from Thomasene Ripple, DO sent at 11/08/2019  1:46 PM EDT ----- Your ascending aorta dilatation is stable at 4.3 cm.  I see you are scheduled for your cardiac CT on November 15, 2019 please get this so we can get more information.

## 2019-11-13 ENCOUNTER — Telehealth (HOSPITAL_COMMUNITY): Payer: Self-pay | Admitting: Emergency Medicine

## 2019-11-13 NOTE — Telephone Encounter (Signed)
Attempted to call patient regarding upcoming cardiac CT appointment. °Left message on voicemail with name and callback number °Tabor Denham RN Navigator Cardiac Imaging °[facility] °[phone] Office °[phone] Cell ° °

## 2019-11-14 ENCOUNTER — Telehealth (HOSPITAL_COMMUNITY): Payer: Self-pay | Admitting: Emergency Medicine

## 2019-11-14 NOTE — Telephone Encounter (Signed)
Pt returning phone call regarding upcoming cardiac imaging study; pt verbalizes understanding of appt date/time, parking situation and where to check in, pre-test NPO status and medications ordered, and verified current allergies; name and call back number provided for further questions should they arise [name] RN Navigator Cardiac Imaging [facility] [phone] office [phone] cell   Pt verbalized understanding NOT to take ED medications and to take PO metoprolol 2 hr prior to scan Ricardo Odom

## 2019-11-15 ENCOUNTER — Ambulatory Visit (HOSPITAL_COMMUNITY)
Admission: RE | Admit: 2019-11-15 | Discharge: 2019-11-15 | Disposition: A | Discharge status: Home / Self Care | Payer: BC Managed Care – PPO | Source: Ambulatory Visit | Attending: Cardiology | Admitting: Cardiology

## 2019-11-15 ENCOUNTER — Other Ambulatory Visit: Payer: BC Managed Care – PPO

## 2019-11-15 ENCOUNTER — Other Ambulatory Visit: Payer: Self-pay

## 2019-11-15 DIAGNOSIS — R072 Precordial pain: Secondary | ICD-10-CM | POA: Diagnosis not present

## 2019-11-15 MED ORDER — NITROGLYCERIN 0.4 MG SL SUBL
SUBLINGUAL_TABLET | SUBLINGUAL | Status: AC
  Filled 2019-11-15: qty 2

## 2019-11-15 MED ORDER — NITROGLYCERIN 0.4 MG SL SUBL
0.8000 mg | SUBLINGUAL_TABLET | Freq: Once | SUBLINGUAL | Status: AC
  Administered 2019-11-15: 0.8 mg via SUBLINGUAL

## 2019-11-15 MED ORDER — IOHEXOL 350 MG/ML SOLN
80.0000 mL | Freq: Once | INTRAVENOUS | Status: AC | PRN
  Administered 2019-11-15: 80 mL via INTRAVENOUS

## 2019-11-20 ENCOUNTER — Telehealth: Payer: Self-pay

## 2019-11-20 MED ORDER — ROSUVASTATIN CALCIUM 5 MG PO TABS: 5.0000 mg | ORAL_TABLET | Freq: Every day | ORAL | 3 refills | Status: DC

## 2019-11-20 NOTE — Telephone Encounter (Signed)
Spoke with patient regarding results and recommendation.  Patient verbalizes understanding and is agreeable to plan of care. Advised patient to call back with any issues or concerns.  

## 2019-11-20 NOTE — Telephone Encounter (Signed)
Patient returning call. He states he works nights and requests a call back early tomorrow morning.

## 2019-11-20 NOTE — Telephone Encounter (Signed)
-----   Message from Thomasene Ripple, DO sent at 11/19/2019  9:40 PM EDT ----- Your CT scan showed evidence of mild coronary artery disease right now medical management. No need for any further intervention. Your aorta is dilated. We will repeat a ct scan for your aorta in 1 year- as we need to monitor this closely.  I would like to start on crestor 5 mg daily.

## 2019-11-20 NOTE — Telephone Encounter (Signed)
Left message on patients voicemail to please return our call.   

## 2019-11-20 NOTE — Addendum Note (Signed)
Addended by: Delorse Limber I on: 11/20/2019 09:31 AM   Modules accepted: Orders

## 2019-11-26 ENCOUNTER — Other Ambulatory Visit: Payer: Self-pay | Admitting: Cardiology

## 2020-01-09 ENCOUNTER — Other Ambulatory Visit: Payer: Self-pay

## 2020-01-09 ENCOUNTER — Ambulatory Visit: Payer: BC Managed Care – PPO | Admitting: Cardiology

## 2020-01-09 ENCOUNTER — Encounter: Payer: Self-pay | Admitting: Cardiology

## 2020-01-09 VITALS — BP 148/94 | HR 90 | Ht 65.0 in | Wt 207.2 lb

## 2020-01-09 DIAGNOSIS — I2510 Atherosclerotic heart disease of native coronary artery without angina pectoris: Secondary | ICD-10-CM | POA: Diagnosis not present

## 2020-01-09 DIAGNOSIS — I10 Essential (primary) hypertension: Secondary | ICD-10-CM

## 2020-01-09 DIAGNOSIS — E669 Obesity, unspecified: Secondary | ICD-10-CM

## 2020-01-09 MED ORDER — LOSARTAN POTASSIUM 50 MG PO TABS: ORAL_TABLET | ORAL | 5 refills | Status: DC

## 2020-01-09 NOTE — Progress Notes (Signed)
Cardiology Office Note:    Date:  01/09/2020   ID:  KENITH TRICKEL, DOB Jun 07, 1975, MRN [id number]  PCP:  Marylen Ponto, MD  Cardiologist:  Thomasene Ripple, DO  Electrophysiologist:  None   Referring MD: Marylen Ponto, MD   " I am doing well"  History of Present Illness:    Ricardo Odom is a 44 y.o. male with a hx of minimal coronary artery disease, hypertension, hyperlipidemia, obesity, PVCs here today for follow-up visit.  Did see the patient in September at that time he reported he had been experiencing intermittent chest pain.  Given his risk factors we ordered a coronary CTA .  In the interim the patient was able to get the study done.  We will call him with the report.  He is here today for follow-up visit.  He is here with his mother.  He offers no complaints at this time.   Past Medical History:  Diagnosis Date  . Aortic root dilatation (HCC) 06/11/2017  . Essential hypertension 05/12/2017  . Hyperlipidemia 06/11/2017  . Hypertension   . Lightheadedness 05/12/2017  . Obesity (BMI 30-39.9) 10/16/2019  . Palpitations 05/12/2017  . Postoperative examination 04/13/2015  . Precordial pain 10/16/2019  . PVC's (premature ventricular contractions) 05/12/2017    Past Surgical History:  Procedure Laterality Date  . APPENDECTOMY    . THROAT SURGERY      Current Medications: Current Meds  Medication Sig  . losartan (COZAAR) 50 MG tablet TAKE 1 TABLET(50 MG) BY MOUTH DAILY  . mesalamine (LIALDA) 1.2 g EC tablet Take 1.2 mg by mouth 4 (four) times daily.  . rosuvastatin (CRESTOR) 5 MG tablet Take 1 tablet (5 mg total) by mouth daily.  . [DISCONTINUED] metoprolol tartrate (LOPRESSOR) 100 MG tablet Take 1 tablet by mouth 2 hours prior to your cardiac ct     Allergies:   Patient has no known allergies.   Social History   Socioeconomic History  . Marital status: Divorced    Spouse name: Not on file  . Number of children: Not on file  . Years of education: Not on file  .  Highest education level: Not on file  Occupational History  . Not on file  Tobacco Use  . Smoking status: Former Smoker    Packs/day: 10.00    Years: 1.00    Pack years: 10.00    Types: Cigarettes    Quit date: 07/28/2002    Years since quitting: 17.4  . Smokeless tobacco: Current User    Types: Snuff  Substance and Sexual Activity  . Alcohol use: Yes    Alcohol/week: 25.0 standard drinks    Types: 25 Cans of beer per week    Comment: daily  . Drug use: No  . Sexual activity: Not on file  Other Topics Concern  . Not on file  Social History Narrative  . Not on file   Social Determinants of Health   Financial Resource Strain: Not on file  Food Insecurity: Not on file  Transportation Needs: Not on file  Physical Activity: Not on file  Stress: Not on file  Social Connections: Not on file     Family History: The patient's family history includes Bradycardia in his father; Healthy in his mother and sister; Heart disease in his paternal grandfather; Other in his father.  ROS:   Review of Systems  Constitution: Negative for decreased appetite, fever and weight gain.  HENT: Negative for congestion, ear discharge, hoarse voice and  sore throat.   Eyes: Negative for discharge, redness, vision loss in right eye and visual halos.  Cardiovascular: Negative for chest pain, dyspnea on exertion, leg swelling, orthopnea and palpitations.  Respiratory: Negative for cough, hemoptysis, shortness of breath and snoring.   Endocrine: Negative for heat intolerance and polyphagia.  Hematologic/Lymphatic: Negative for bleeding problem. Does not bruise/bleed easily.  Skin: Negative for flushing, nail changes, rash and suspicious lesions.  Musculoskeletal: Negative for arthritis, joint pain, muscle cramps, myalgias, neck pain and stiffness.  Gastrointestinal: Negative for abdominal pain, bowel incontinence, diarrhea and excessive appetite.  Genitourinary: Negative for decreased libido, genital  sores and incomplete emptying.  Neurological: Negative for brief paralysis, focal weakness, headaches and loss of balance.  Psychiatric/Behavioral: Negative for altered mental status, depression and suicidal ideas.  Allergic/Immunologic: Negative for HIV exposure and persistent infections.    EKGs/Labs/Other Studies Reviewed:    The following studies were reviewed today:   EKG: None today  Coronary CTA Aorta: Mild dilated aortic root, 42 mm at sinus of Valsalva, 32 mm ascending root. Mild proximal calcification. No dissection.  Aortic Valve:  Trileaflet.  No calcifications.  Coronary Arteries:  Normal coronary origin.  Right dominance.  RCA is a small non dominant artery with calcified ostial plaque, 0-24%.  Left main is a large artery that gives rise to LAD and LCX arteries.  LAD is a large vessel that has scattered calcified proximal to mid plaque, 0-24%. There is a large first diagonal with proximal calcified plaque, 0-24%.  Wraps around apex.  LCX is a dominant artery that gives rise to 3 small caliber OM branches with PDA. There is no plaque.  Other findings:  Normal pulmonary vein drainage into the left atrium.  Normal left atrial appendage without a thrombus.  Normal size of the pulmonary artery.  Please see radiology report for non cardiac findings.  IMPRESSION: 1. Coronary calcium score of 39. This was 90 percentile for age and sex matched control.  2. Normal coronary origin with LEFT dominance.  3. Mild multivessel calcified coronary plaque, 0-24%.  4. Mild dilated aortic root, 42 mm at sinus of Valsalva.  5. CAD-RADS 1. Minimal non-obstructive CAD (0-24%). Consider non-atherosclerotic causes of chest pain. Consider preventive therapy and risk factor modification.  Donato Schultz, MD [facility]  TTE Impression Study Conclusions   - Left ventricle: The cavity size was normal. Wall thickness was  normal. Systolic function was normal.  The estimated ejection  fraction was in the range of 55% to 60%. Wall motion was normal;  there were no regional wall motion abnormalities. Doppler  parameters are consistent with abnormal left ventricular  relaxation (grade 1 diastolic dysfunction).  - Aortic valve: There was mild regurgitation.  - Aortic root: The aortic root was mildly dilated.   Recent Labs: 10/11/2019: BUN 12; Creatinine, Ser 0.95; Potassium 4.0; Sodium 139  Recent Lipid Panel    Component Value Date/Time   CHOL 199 10/16/2019 0912   TRIG 121 10/16/2019 0912   HDL 54 10/16/2019 0912   CHOLHDL 3.7 10/16/2019 0912   LDLCALC 123 (H) 10/16/2019 0912    Physical Exam:    VS:  BP (!) 148/94   Pulse 90   Ht 5\' 5"  (1.651 m)   Wt 207 lb 3.2 oz (94 kg)   SpO2 98%   BMI 34.48 kg/m     Wt Readings from Last 3 Encounters:  01/09/20 207 lb 3.2 oz (94 kg)  10/16/19 209 lb 12.8 oz (95.2 kg)  10/18/18 208  lb (94.3 kg)     GEN: Well nourished, well developed in no acute distress HEENT: Normal NECK: No JVD; No carotid bruits LYMPHATICS: No lymphadenopathy CARDIAC: S1S2 noted,RRR, no murmurs, rubs, gallops RESPIRATORY:  Clear to auscultation without rales, wheezing or rhonchi  ABDOMEN: Soft, non-tender, non-distended, +bowel sounds, no guarding. EXTREMITIES: No edema, No cyanosis, no clubbing MUSCULOSKELETAL:  No deformity  SKIN: Warm and dry NEUROLOGIC:  Alert and oriented x 3, non-focal PSYCHIATRIC:  Normal affect, good insight  ASSESSMENT:    No diagnosis found. PLAN:     We discussed his testing results.  He does not have any questions at this time.  He is on Crestor 5 mg daily which was started after minimal coronary artery disease.  He is hypertensive in the office today.  His goal is less than 130/80 mmHg.  I am going to increase his losartan to 50 mg in the morning and 20 5 in the afternoon.  He is agreeable for this at this time.  He will take his blood pressure daily and bring this at his  follow-up visit. The patient understands the need to lose weight with diet and exercise. We have discussed specific strategies for this.  The patient is in agreement with the above plan. The patient left the office in stable condition.  The patient will follow up in 8 weeks or sooner if needed.   Medication Adjustments/Labs and Tests Ordered: Current medicines are reviewed at length with the patient today.  Concerns regarding medicines are outlined above.  No orders of the defined types were placed in this encounter.  No orders of the defined types were placed in this encounter.   There are no Patient Instructions on file for this visit.   Adopting a Healthy Lifestyle.  Know what a healthy weight is for you (roughly BMI <25) and aim to maintain this   Aim for 7+ servings of fruits and vegetables daily   65-80+ fluid ounces of water or unsweet tea for healthy kidneys   Limit to max 1 drink of alcohol per day; avoid smoking/tobacco   Limit animal fats in diet for cholesterol and heart health - choose grass fed whenever available   Avoid highly processed foods, and foods high in saturated/trans fats   Aim for low stress - take time to unwind and care for your mental health   Aim for 150 min of moderate intensity exercise weekly for heart health, and weights twice weekly for bone health   Aim for 7-9 hours of sleep daily   When it comes to diets, agreement about the perfect plan isnt easy to find, even among the experts. Experts at the [facility] [employer] developed an idea known as the Healthy Eating Plate. Just imagine a plate divided into logical, healthy portions.   The emphasis is on diet quality:   Load up on vegetables and fruits - one-half of your plate: Aim for color and variety, and remember that potatoes dont count.   Go for whole grains - one-quarter of your plate: Whole wheat, barley, wheat berries, quinoa, oats, brown rice, and foods made with them. If  you want pasta, go with whole wheat pasta.   Protein power - one-quarter of your plate: Fish, chicken, beans, and nuts are all healthy, versatile protein sources. Limit red meat.   The diet, however, does go beyond the plate, offering a few other suggestions.   Use healthy plant oils, such as olive, canola, soy, corn, sunflower and  peanut. Check the labels, and avoid partially hydrogenated oil, which have unhealthy trans fats.   If youre thirsty, drink water. Coffee and tea are good in moderation, but skip sugary drinks and limit milk and dairy products to one or two daily servings.   The type of carbohydrate in the diet is more important than the amount. Some sources of carbohydrates, such as vegetables, fruits, whole grains, and beans-are healthier than others.   Finally, stay active  Signed, Thomasene RippleKardie Elza Varricchio, DO  01/09/2020 8:28 AM    [facility]

## 2020-01-09 NOTE — Patient Instructions (Signed)
Medication Instructions:  Your physician has recommended you make the following change in your medication:   Take Losartan 50 mg in the AM and 25 mg in the PM.  *If you need a refill on your cardiac medications before your next appointment, please call your pharmacy*   Lab Work: None ordered If you have labs (blood work) drawn today and your tests are completely normal, you will receive your results only by: Marland Kitchen MyChart Message (if you have MyChart) OR . A paper copy in the mail If you have any lab test that is abnormal or we need to change your treatment, we will call you to review the results.   Testing/Procedures: None ordered   Follow-Up: At [facility], you and your health needs are our priority.  As part of our continuing mission to provide you with exceptional heart care, we have created designated Provider Care Teams.  These Care Teams include your primary Cardiologist (physician) and Advanced Practice Providers (APPs -  Physician Assistants and Nurse Practitioners) who all work together to provide you with the care you need, when you need it.  We recommend signing up for the patient portal called "MyChart".  Sign up information is provided on this After Visit Summary.  MyChart is used to connect with patients for Virtual Visits (Telemedicine).  Patients are able to view lab/test results, encounter notes, upcoming appointments, etc.  Non-urgent messages can be sent to your provider as well.   To learn more about what you can do with MyChart, go to [email or web address].    Your next appointment:   8 week(s)  The format for your next appointment:   In Person  Provider:   Thomasene Ripple, DO   Other Instructions Losartan Tablets What is this medicine? LOSARTAN (loe SAR tan) is an angiotensin II receptor blocker, also known as an ARB. It treats high blood pressure. It can slow kidney damage in some patients. It may also be used to lower the risk of stroke. This medicine  may be used for other purposes; ask your health care provider or pharmacist if you have questions. COMMON BRAND NAME(S): Cozaar What should I tell my health care provider before I take this medicine? They need to know if you have any of these conditions:  heart failure  kidney or liver disease  an unusual or allergic reaction to losartan, other medicines, foods, dyes, or preservatives  pregnant or trying to get pregnant  breast-feeding How should I use this medicine? Take this drug by mouth. Take it as directed on the prescription label at the same time every day. You can take it with or without food. If it upsets your stomach, take it with food. Keep taking it unless your health care provider tells you to stop. Talk to your health care provider about the use of this drug in children. While it may be prescribed for children as young as 6 for selected conditions, precautions do apply. Overdosage: If you think you have taken too much of this medicine contact a poison control center or emergency room at once. NOTE: This medicine is only for you. Do not share this medicine with others. What if I miss a dose? If you miss a dose, take it as soon as you can. If it is almost time for your next dose, take only that dose. Do not take double or extra doses. What may interact with this medicine?  blood pressure medicines  diuretics, especially triamterene, spironolactone, or amiloride  fluconazole  NSAIDs,  medicines for pain and inflammation, like ibuprofen or naproxen  potassium salts or potassium supplements  rifampin This list may not describe all possible interactions. Give your health care provider a list of all the medicines, herbs, non-prescription drugs, or dietary supplements you use. Also tell them if you smoke, drink alcohol, or use illegal drugs. Some items may interact with your medicine. What should I watch for while using this medicine? Visit your doctor or health care  professional for regular checks on your progress. Check your blood pressure as directed. Ask your doctor or health care professional what your blood pressure should be and when you should contact him or her. Call your doctor or health care professional if you notice an irregular or fast heart beat. Women should inform their doctor if they wish to become pregnant or think they might be pregnant. There is a potential for serious side effects to an unborn child, particularly in the second or third trimester. Talk to your health care professional or pharmacist for more information. You may get drowsy or dizzy. Do not drive, use machinery, or do anything that needs mental alertness until you know how this drug affects you. Do not stand or sit up quickly, especially if you are an older patient. This reduces the risk of dizzy or fainting spells. Alcohol can make you more drowsy and dizzy. Avoid alcoholic drinks. Avoid salt substitutes unless you are told otherwise by your doctor or health care professional. Do not treat yourself for coughs, colds, or pain while you are taking this medicine without asking your doctor or health care professional for advice. Some ingredients may increase your blood pressure. What side effects may I notice from receiving this medicine? Side effects that you should report to your doctor or health care professional as soon as possible:  confusion, dizziness, light headedness or fainting spells  decreased amount of urine passed  difficulty breathing or swallowing, hoarseness, or tightening of the throat  fast or irregular heart beat, palpitations, or chest pain  skin rash, itching  swelling of your face, lips, tongue, hands, or feet Side effects that usually do not require medical attention (report to your doctor or health care professional if they continue or are bothersome):  cough  decreased sexual function or desire  headache  nasal congestion or  stuffiness  nausea or stomach pain  sore or cramping muscles This list may not describe all possible side effects. Call your doctor for medical advice about side effects. You may report side effects to FDA at 1-800-FDA-1088. Where should I keep my medicine? Keep out of the reach of children and pets. Store at room temperature between 15 and 30 degrees C (59 and 86 degrees F). Protect from light. Keep the container tightly closed. Throw away any unused drug after the expiration date. NOTE: This sheet is a summary. It may not cover all possible information. If you have questions about this medicine, talk to your doctor, pharmacist, or health care provider.  2020 [employer]/Gold Standard (2018-08-15 12:12:28)

## 2020-01-22 DIAGNOSIS — Z6833 Body mass index (BMI) 33.0-33.9, adult: Secondary | ICD-10-CM | POA: Diagnosis not present

## 2020-01-22 DIAGNOSIS — Z1331 Encounter for screening for depression: Secondary | ICD-10-CM | POA: Diagnosis not present

## 2020-01-22 DIAGNOSIS — F411 Generalized anxiety disorder: Secondary | ICD-10-CM | POA: Diagnosis not present

## 2020-03-04 DIAGNOSIS — F411 Generalized anxiety disorder: Secondary | ICD-10-CM | POA: Diagnosis not present

## 2020-03-04 DIAGNOSIS — Z6833 Body mass index (BMI) 33.0-33.9, adult: Secondary | ICD-10-CM | POA: Diagnosis not present

## 2020-03-05 ENCOUNTER — Ambulatory Visit: Payer: BC Managed Care – PPO | Admitting: Cardiology

## 2020-03-05 ENCOUNTER — Other Ambulatory Visit: Payer: Self-pay

## 2020-03-05 ENCOUNTER — Encounter: Payer: Self-pay | Admitting: Cardiology

## 2020-03-05 VITALS — BP 138/80 | HR 82 | Ht 65.0 in | Wt 208.0 lb

## 2020-03-05 DIAGNOSIS — I77810 Thoracic aortic ectasia: Secondary | ICD-10-CM

## 2020-03-05 DIAGNOSIS — I10 Essential (primary) hypertension: Secondary | ICD-10-CM

## 2020-03-05 DIAGNOSIS — E782 Mixed hyperlipidemia: Secondary | ICD-10-CM

## 2020-03-05 DIAGNOSIS — I2510 Atherosclerotic heart disease of native coronary artery without angina pectoris: Secondary | ICD-10-CM | POA: Insufficient documentation

## 2020-03-05 DIAGNOSIS — E669 Obesity, unspecified: Secondary | ICD-10-CM

## 2020-03-05 NOTE — Patient Instructions (Signed)

## 2020-03-05 NOTE — Progress Notes (Signed)
Cardiology Office Note:    Date:  03/05/2020   ID:  BRADFORD CAZIER, DOB June 27, 1975, MRN [id number]  PCP:  Marylen Ponto, MD  Cardiologist:  Thomasene Ripple, DO  Electrophysiologist:  None   Referring MD: Marylen Ponto, MD   I am doing much better  History of Present Illness:    Ricardo Odom is a 45 y.o. male with a hx of normal nonobstructive coronary artery disease, hypertension, hyperlipidemia is here today for follow-up visit.  Saw the patient in January 09, 2020 at that time we started him on Crestor increase his losartan from 50 mg to 50 mg in the morning and 25 mg at night.  We also talked about his elevated aortic root.  He is here today for follow-up visit.  Since I saw the patient he saw his PCP who started him on Prozac this has helped with anxiety greatly.  Past Medical History:  Diagnosis Date  . Aortic root dilatation (HCC) 06/11/2017  . Essential hypertension 05/12/2017  . Hyperlipidemia 06/11/2017  . Hypertension   . Lightheadedness 05/12/2017  . Obesity (BMI 30-39.9) 10/16/2019  . Palpitations 05/12/2017  . Postoperative examination 04/13/2015  . Precordial pain 10/16/2019  . PVC's (premature ventricular contractions) 05/12/2017    Past Surgical History:  Procedure Laterality Date  . APPENDECTOMY    . THROAT SURGERY      Current Medications: Current Meds  Medication Sig  . FLUoxetine (PROZAC) 20 MG capsule Take 20 mg by mouth daily.  Marland Kitchen losartan (COZAAR) 50 MG tablet Take 50 mg Losartan in the AM and 25 mg in the pm.  . mesalamine (LIALDA) 1.2 g EC tablet Take 1.2 mg by mouth 4 (four) times daily.  . rosuvastatin (CRESTOR) 5 MG tablet Take 1 tablet (5 mg total) by mouth daily.     Allergies:   Patient has no known allergies.   Social History   Socioeconomic History  . Marital status: Divorced    Spouse name: Not on file  . Number of children: Not on file  . Years of education: Not on file  . Highest education level: Not on file  Occupational History   . Not on file  Tobacco Use  . Smoking status: Former Smoker    Packs/day: 10.00    Years: 1.00    Pack years: 10.00    Types: Cigarettes    Quit date: 07/28/2002    Years since quitting: 17.6  . Smokeless tobacco: Current User    Types: Snuff  Substance and Sexual Activity  . Alcohol use: Yes    Alcohol/week: 25.0 standard drinks    Types: 25 Cans of beer per week    Comment: daily  . Drug use: No  . Sexual activity: Not on file  Other Topics Concern  . Not on file  Social History Narrative  . Not on file   Social Determinants of Health   Financial Resource Strain: Not on file  Food Insecurity: Not on file  Transportation Needs: Not on file  Physical Activity: Not on file  Stress: Not on file  Social Connections: Not on file     Family History: The patient's family history includes Bradycardia in his father; Healthy in his mother and sister; Heart disease in his paternal grandfather; Other in his father.  ROS:   Review of Systems  Constitution: Negative for decreased appetite, fever and weight gain.  HENT: Negative for congestion, ear discharge, hoarse voice and sore throat.   Eyes: Negative  for discharge, redness, vision loss in right eye and visual halos.  Cardiovascular: Negative for chest pain, dyspnea on exertion, leg swelling, orthopnea and palpitations.  Respiratory: Negative for cough, hemoptysis, shortness of breath and snoring.   Endocrine: Negative for heat intolerance and polyphagia.  Hematologic/Lymphatic: Negative for bleeding problem. Does not bruise/bleed easily.  Skin: Negative for flushing, nail changes, rash and suspicious lesions.  Musculoskeletal: Negative for arthritis, joint pain, muscle cramps, myalgias, neck pain and stiffness.  Gastrointestinal: Negative for abdominal pain, bowel incontinence, diarrhea and excessive appetite.  Genitourinary: Negative for decreased libido, genital sores and incomplete emptying.  Neurological: Negative for  brief paralysis, focal weakness, headaches and loss of balance.  Psychiatric/Behavioral: Negative for altered mental status, depression and suicidal ideas.  Allergic/Immunologic: Negative for HIV exposure and persistent infections.    EKGs/Labs/Other Studies Reviewed:    The following studies were reviewed today:   EKG: None today  Coronary CTA Aorta: Mild dilated aortic root, 42 mm at sinus of Valsalva, 32 mm ascending root. Mild proximal calcification. No dissection.  Aortic Valve: Trileaflet. No calcifications.  Coronary Arteries: Normal coronary origin. Right dominance.  RCA is a small non dominant artery with calcified ostial plaque, 0-24%.  Left main is a large artery that gives rise to LAD and LCX arteries.  LAD is a large vessel that has scattered calcified proximal to mid plaque, 0-24%. There is a large first diagonal with proximal calcified plaque, 0-24%.  Wraps around apex.  LCX is a dominant artery that gives rise to 3 small caliber OM branches with PDA. There is no plaque.  Other findings:  Normal pulmonary vein drainage into the left atrium.  Normal left atrial appendage without a thrombus.  Normal size of the pulmonary artery.  Please see radiology report for non cardiac findings.  IMPRESSION: 1. Coronary calcium score of 39. This was 90 percentile for age and sex matched control.  2. Normal coronary origin with LEFT dominance.  3. Mild multivessel calcified coronary plaque, 0-24%.  4. Mild dilated aortic root, 42 mm at sinus of Valsalva.  5. CAD-RADS 1. Minimal non-obstructive CAD (0-24%). Consider non-atherosclerotic causes of chest pain. Consider preventive therapy and risk factor modification.  Donato Schultz, MD [facility]  TTE Impression Study Conclusions   - Left ventricle: The cavity size was normal. Wall thickness was  normal. Systolic function was normal. The estimated ejection  fraction was in the range of  55% to 60%. Wall motion was normal;  there were no regional wall motion abnormalities. Doppler  parameters are consistent with abnormal left ventricular  relaxation (grade 1 diastolic dysfunction).  - Aortic valve: There was mild regurgitation.  - Aortic root: The aortic root was mildly dilated.   Recent Labs: 10/11/2019: BUN 12; Creatinine, Ser 0.95; Potassium 4.0; Sodium 139  Recent Lipid Panel    Component Value Date/Time   CHOL 199 10/16/2019 0912   TRIG 121 10/16/2019 0912   HDL 54 10/16/2019 0912   CHOLHDL 3.7 10/16/2019 0912   LDLCALC 123 (H) 10/16/2019 0912    Physical Exam:    VS:  BP 138/80   Pulse 82   Ht 5\' 5"  (1.651 m)   Wt 208 lb (94.3 kg)   SpO2 98%   BMI 34.61 kg/m     Wt Readings from Last 3 Encounters:  03/05/20 208 lb (94.3 kg)  01/09/20 207 lb 3.2 oz (94 kg)  10/16/19 209 lb 12.8 oz (95.2 kg)     GEN: Well nourished, well developed in  no acute distress HEENT: Normal NECK: No JVD; No carotid bruits LYMPHATICS: No lymphadenopathy CARDIAC: S1S2 noted,RRR, no murmurs, rubs, gallops RESPIRATORY:  Clear to auscultation without rales, wheezing or rhonchi  ABDOMEN: Soft, non-tender, non-distended, +bowel sounds, no guarding. EXTREMITIES: No edema, No cyanosis, no clubbing MUSCULOSKELETAL:  No deformity  SKIN: Warm and dry NEUROLOGIC:  Alert and oriented x 3, non-focal PSYCHIATRIC:  Normal affect, good insight  ASSESSMENT:    1. Essential hypertension   2. Obesity (BMI 30-39.9)   3. Aortic root dilatation (HCC)   4. Mixed hyperlipidemia   5. Minimal CAD    PLAN:     1.  His blood pressure has improved significantly we will continue patient on his current antihypertensive regimen.  No anginal symptoms continue patient on Crestor. The patient understands the need to lose weight with diet and exercise. We have discussed specific strategies for this. Follow-up in 1 year or sooner if needed.  At his 1 year visit we will get a repeat chest CTA  preceded with dilatation.  The patient is in agreement with the above plan. The patient left the office in stable condition.  The patient will follow up in 1 year   Medication Adjustments/Labs and Tests Ordered: Current medicines are reviewed at length with the patient today.  Concerns regarding medicines are outlined above.  No orders of the defined types were placed in this encounter.  No orders of the defined types were placed in this encounter.   Patient Instructions  Medication Instructions:  Your physician recommends that you continue on your current medications as directed. Please refer to the Current Medication list given to you today.  *If you need a refill on your cardiac medications before your next appointment, please call your pharmacy*   Lab Work: None If you have labs (blood work) drawn today and your tests are completely normal, you will receive your results only by: Marland Kitchen MyChart Message (if you have MyChart) OR . A paper copy in the mail If you have any lab test that is abnormal or we need to change your treatment, we will call you to review the results.   Testing/Procedures: None   Follow-Up: At [facility], you and your health needs are our priority.  As part of our continuing mission to provide you with exceptional heart care, we have created designated Provider Care Teams.  These Care Teams include your primary Cardiologist (physician) and Advanced Practice Providers (APPs -  Physician Assistants and Nurse Practitioners) who all work together to provide you with the care you need, when you need it.  We recommend signing up for the patient portal called "MyChart".  Sign up information is provided on this After Visit Summary.  MyChart is used to connect with patients for Virtual Visits (Telemedicine).  Patients are able to view lab/test results, encounter notes, upcoming appointments, etc.  Non-urgent messages can be sent to your provider as well.   To learn more  about what you can do with MyChart, go to [email or web address].    Your next appointment:   1 year(s)  The format for your next appointment:   In Person  Provider:   Thomasene Ripple, DO   Other Instructions **     Adopting a Healthy Lifestyle.  Know what a healthy weight is for you (roughly BMI <25) and aim to maintain this   Aim for 7+ servings of fruits and vegetables daily   65-80+ fluid ounces of water or unsweet tea for healthy kidneys  Limit to max 1 drink of alcohol per day; avoid smoking/tobacco   Limit animal fats in diet for cholesterol and heart health - choose grass fed whenever available   Avoid highly processed foods, and foods high in saturated/trans fats   Aim for low stress - take time to unwind and care for your mental health   Aim for 150 min of moderate intensity exercise weekly for heart health, and weights twice weekly for bone health   Aim for 7-9 hours of sleep daily   When it comes to diets, agreement about the perfect plan isnt easy to find, even among the experts. Experts at the [facility] of [employer] developed an idea known as the Healthy Eating Plate. Just imagine a plate divided into logical, healthy portions.   The emphasis is on diet quality:   Load up on vegetables and fruits - one-half of your plate: Aim for color and variety, and remember that potatoes dont count.   Go for whole grains - one-quarter of your plate: Whole wheat, barley, wheat berries, quinoa, oats, brown rice, and foods made with them. If you want pasta, go with whole wheat pasta.   Protein power - one-quarter of your plate: Fish, chicken, beans, and nuts are all healthy, versatile protein sources. Limit red meat.   The diet, however, does go beyond the plate, offering a few other suggestions.   Use healthy plant oils, such as olive, canola, soy, corn, sunflower and peanut. Check the labels, and avoid partially hydrogenated oil, which have unhealthy trans  fats.   If youre thirsty, drink water. Coffee and tea are good in moderation, but skip sugary drinks and limit milk and dairy products to one or two daily servings.   The type of carbohydrate in the diet is more important than the amount. Some sources of carbohydrates, such as vegetables, fruits, whole grains, and beans-are healthier than others.   Finally, stay active  Signed, Thomasene Ripple, DO  03/05/2020 8:30 AM    [facility]

## 2020-05-25 ENCOUNTER — Other Ambulatory Visit: Payer: Self-pay | Admitting: Cardiology

## 2020-05-26 NOTE — Telephone Encounter (Signed)
Losartan approved and sent 

## 2020-09-02 DIAGNOSIS — K5100 Ulcerative (chronic) pancolitis without complications: Secondary | ICD-10-CM | POA: Diagnosis not present

## 2020-09-03 DIAGNOSIS — K5100 Ulcerative (chronic) pancolitis without complications: Secondary | ICD-10-CM | POA: Diagnosis not present

## 2020-09-24 DIAGNOSIS — Z6835 Body mass index (BMI) 35.0-35.9, adult: Secondary | ICD-10-CM | POA: Diagnosis not present

## 2020-09-24 DIAGNOSIS — F329 Major depressive disorder, single episode, unspecified: Secondary | ICD-10-CM | POA: Diagnosis not present

## 2020-11-01 ENCOUNTER — Other Ambulatory Visit: Payer: Self-pay | Admitting: Cardiology

## 2020-11-24 DIAGNOSIS — F329 Major depressive disorder, single episode, unspecified: Secondary | ICD-10-CM | POA: Diagnosis not present

## 2020-11-24 DIAGNOSIS — Z6835 Body mass index (BMI) 35.0-35.9, adult: Secondary | ICD-10-CM | POA: Diagnosis not present

## 2021-02-16 ENCOUNTER — Other Ambulatory Visit: Payer: Self-pay | Admitting: Cardiology

## 2021-03-18 DIAGNOSIS — J40 Bronchitis, not specified as acute or chronic: Secondary | ICD-10-CM | POA: Diagnosis not present

## 2021-05-17 ENCOUNTER — Other Ambulatory Visit: Payer: Self-pay | Admitting: Cardiology

## 2021-05-25 DIAGNOSIS — K5100 Ulcerative (chronic) pancolitis without complications: Secondary | ICD-10-CM | POA: Diagnosis not present

## 2021-05-27 DIAGNOSIS — Z1212 Encounter for screening for malignant neoplasm of rectum: Secondary | ICD-10-CM | POA: Diagnosis not present

## 2021-05-27 DIAGNOSIS — K5100 Ulcerative (chronic) pancolitis without complications: Secondary | ICD-10-CM | POA: Diagnosis not present

## 2021-09-10 DIAGNOSIS — K5190 Ulcerative colitis, unspecified, without complications: Secondary | ICD-10-CM | POA: Diagnosis not present

## 2021-09-10 DIAGNOSIS — K529 Noninfective gastroenteritis and colitis, unspecified: Secondary | ICD-10-CM | POA: Diagnosis not present

## 2021-09-10 DIAGNOSIS — K5100 Ulcerative (chronic) pancolitis without complications: Secondary | ICD-10-CM | POA: Diagnosis not present

## 2021-11-15 ENCOUNTER — Other Ambulatory Visit: Payer: Self-pay | Admitting: Cardiology

## 2021-11-18 ENCOUNTER — Telehealth: Payer: Self-pay | Admitting: Cardiology

## 2021-11-18 MED ORDER — LOSARTAN POTASSIUM 50 MG PO TABS: ORAL_TABLET | ORAL | 0 refills | Status: DC

## 2021-11-18 NOTE — Telephone Encounter (Signed)
*  STAT* If patient is at the pharmacy, call can be transferred to refill team.   1. Which medications need to be refilled? (please list name of each medication and dose if known) losartan (COZAAR) 50 MG tablet  2. Which pharmacy/location (including street and city if local pharmacy) is medication to be sent to? [id number] [place], [place] - [address]  3. Do they need a 30 day or 90 day supply? Needs enough medication to last him until 11/22 appt.

## 2021-12-15 ENCOUNTER — Ambulatory Visit: Payer: BC Managed Care – PPO | Admitting: Cardiology

## 2022-01-06 ENCOUNTER — Encounter: Payer: Self-pay | Admitting: Cardiology

## 2022-01-06 ENCOUNTER — Ambulatory Visit: Payer: BC Managed Care – PPO | Attending: Cardiology | Admitting: Cardiology

## 2022-01-06 VITALS — BP 150/92 | HR 90 | Ht 65.0 in | Wt 222.0 lb

## 2022-01-06 DIAGNOSIS — I10 Essential (primary) hypertension: Secondary | ICD-10-CM

## 2022-01-06 DIAGNOSIS — E782 Mixed hyperlipidemia: Secondary | ICD-10-CM | POA: Diagnosis not present

## 2022-01-06 DIAGNOSIS — I2510 Atherosclerotic heart disease of native coronary artery without angina pectoris: Secondary | ICD-10-CM | POA: Diagnosis not present

## 2022-01-06 DIAGNOSIS — I77810 Thoracic aortic ectasia: Secondary | ICD-10-CM

## 2022-01-06 MED ORDER — ROSUVASTATIN CALCIUM 5 MG PO TABS: 5.0000 mg | ORAL_TABLET | Freq: Every day | ORAL | 3 refills | Status: DC

## 2022-01-06 MED ORDER — AMLODIPINE BESYLATE 5 MG PO TABS: 5.0000 mg | ORAL_TABLET | Freq: Every day | ORAL | 3 refills | Status: DC

## 2022-01-06 NOTE — Progress Notes (Signed)
Cardiology Office Note:    Date:  01/06/2022   ID:  [name], DOB July 16, 1975, MRN [id number]  PCP:  Marylen Ponto, MD  Cardiologist:  Gypsy Balsam, MD    Referring MD: Marylen Ponto, MD   Chief Complaint  Patient presents with   Annual Exam    Doing well    History of Present Illness:    Ricardo Odom is a 46 y.o. male past medical history significant for minimal coronary artery disease, he did have coronary CT angio in October 2021 with 0 to 24% stenosis in the diagonal and LAD, also essential hypertension, enlargement of the aortic root measured at the level of coronary sinus of between 42 to 44 mm stable over period of last few years, essential hypertension, dyslipidemia. Comes today 2 months for follow-up.  Overall doing very well.  Denies have any chest pain tightness squeezing pressure burning chest.  No palpitation dizziness swelling of lower extremities  Past Medical History:  Diagnosis Date   Aortic root dilatation (HCC) 06/11/2017   Essential hypertension 05/12/2017   Hyperlipidemia 06/11/2017   Hypertension    Lightheadedness 05/12/2017   Obesity (BMI 30-39.9) 10/16/2019   Palpitations 05/12/2017   Postoperative examination 04/13/2015   Precordial pain 10/16/2019   PVC's (premature ventricular contractions) 05/12/2017    Past Surgical History:  Procedure Laterality Date   APPENDECTOMY     THROAT SURGERY      Current Medications: Current Meds  Medication Sig   losartan (COZAAR) 50 MG tablet TAKE 1 TABLET BY MOUTH IN THE MORNING AND 1/2 TAB IN THE EVENING (Patient taking differently: Take 50-75 mg by mouth See admin instructions. TAKE 1 TABLET BY MOUTH IN THE MORNING AND 1/2 TAB IN THE EVENING)   mesalamine (LIALDA) 1.2 g EC tablet Take 1.2 mg by mouth 4 (four) times daily.   rosuvastatin (CRESTOR) 5 MG tablet TAKE 1 TABLET BY MOUTH EVERY DAY (Patient taking differently: Take 5 mg by mouth daily.)   vortioxetine HBr (TRINTELLIX) 10 MG TABS tablet Take  10 mg by mouth daily.   [DISCONTINUED] FLUoxetine (PROZAC) 20 MG capsule Take 20 mg by mouth daily.     Allergies:   Patient has no known allergies.   Social History   Socioeconomic History   Marital status: Divorced    Spouse name: Not on file   Number of children: Not on file   Years of education: Not on file   Highest education level: Not on file  Occupational History   Not on file  Tobacco Use   Smoking status: Former    Packs/day: 10.00    Years: 1.00    Total pack years: 10.00    Types: Cigarettes    Quit date: 07/28/2002    Years since quitting: 19.4   Smokeless tobacco: Current    Types: Snuff  Substance and Sexual Activity   Alcohol use: Yes    Alcohol/week: 25.0 standard drinks of alcohol    Types: 25 Cans of beer per week    Comment: daily   Drug use: No   Sexual activity: Not on file  Other Topics Concern   Not on file  Social History Narrative   Not on file   Social Determinants of Health   Financial Resource Strain: Not on file  Food Insecurity: Not on file  Transportation Needs: Not on file  Physical Activity: Not on file  Stress: Not on file  Social Connections: Not on file  Family History: The patient's family history includes Bradycardia in his father; Healthy in his mother and sister; Heart disease in his paternal grandfather; Other in his father. ROS:   Please see the history of present illness.    All 14 point review of systems negative except as described per history of present illness  EKGs/Labs/Other Studies Reviewed:      Recent Labs: No results found for requested labs within last 365 days.  Recent Lipid Panel    Component Value Date/Time   CHOL 199 10/16/2019 0912   TRIG 121 10/16/2019 0912   HDL 54 10/16/2019 0912   CHOLHDL 3.7 10/16/2019 0912   LDLCALC 123 (H) 10/16/2019 0912    Physical Exam:    VS:  BP (!) 150/92 (BP Location: Left Arm, Patient Position: Sitting)   Pulse 90   Ht 5\' 5"  (1.651 m)   Wt 222 lb  (100.7 kg)   SpO2 93%   BMI 36.94 kg/m     Wt Readings from Last 3 Encounters:  01/06/22 222 lb (100.7 kg)  03/05/20 208 lb (94.3 kg)  01/09/20 207 lb 3.2 oz (94 kg)     GEN:  Well nourished, well developed in no acute distress HEENT: Normal NECK: No JVD; No carotid bruits LYMPHATICS: No lymphadenopathy CARDIAC: RRR, no murmurs, no rubs, no gallops RESPIRATORY:  Clear to auscultation without rales, wheezing or rhonchi  ABDOMEN: Soft, non-tender, non-distended MUSCULOSKELETAL:  No edema; No deformity  SKIN: Warm and dry LOWER EXTREMITIES: no swelling NEUROLOGIC:  Alert and oriented x 3 PSYCHIATRIC:  Normal affect   ASSESSMENT:    1. Coronary artery disease involving native coronary artery of native heart without angina pectoris   2. Aortic root dilatation (HCC)   3. Essential hypertension   4. Minimal CAD   5. Mixed hyperlipidemia    PLAN:    In order of problems listed above:  Enlargement of the ascending aorta however measuring 42 to 44 mm at the level of coronary sinuses.  It is only mildly enlarged if at all.  On top of that since this is only ascending aorta and coronary sinuses I think we can follow-up with echocardiogram, therefore, I will schedule him to have echocardiogram to look at the size of the ascending aorta and coronary sinuses. Coronary disease only minimal based on coronary CT angio from 2021.  He is on antiplatelet therapy in form of aspirin, the key will be to reduce his blood pressure as well as control his cholesterol. Dyslipidemia he was put on Crestor 5 however he stopped taking it because he heard that it can cause dementia.  I explained to him that this is highly unlikely and I want him to go back on the medication hopefully he will do I will check his fasting lipid profile 6 weeks Essential hypertension blood pressure elevated today he tells me when he checked it at home it is usually 150 systolic, therefore I will add amlodipine 5 mg daily to his  medical regimen.   Medication Adjustments/Labs and Tests Ordered: Current medicines are reviewed at length with the patient today.  Concerns regarding medicines are outlined above.  Orders Placed This Encounter  Procedures   EKG 12-Lead   Medication changes: No orders of the defined types were placed in this encounter.   Signed, 2022, MD, [facility] 01/06/2022 9:05 AM    [facility]

## 2022-01-06 NOTE — Patient Instructions (Signed)
Medication Instructions:  Your physician has recommended you make the following change in your medication:  Start Amlodipine 5 mg once daily  *If you need a refill on your cardiac medications before your next appointment, please call your pharmacy*   Lab Work: Your physician recommends that you return for lab work in: 6 weeks (around 1/25) for Fasting Lipid Panel, AST & ALT Lab opens at 8am. You DO NOT NEED an appointment. Best time to come is between 8am and 12noon and between 1:30 and 4:30. If you have been asked to fast for your blood work please have nothing to eat or drink after midnight. You may have water.   If you have labs (blood work) drawn today and your tests are completely normal, you will receive your results only by: MyChart Message (if you have MyChart) OR A paper copy in the mail If you have any lab test that is abnormal or we need to change your treatment, we will call you to review the results.   Testing/Procedures: Your physician has requested that you have an echocardiogram. Echocardiography is a painless test that uses sound waves to create images of your heart. It provides your doctor with information about the size and shape of your heart and how well your heart's chambers and valves are working. This procedure takes approximately one hour. There are no restrictions for this procedure. Please do NOT wear cologne, perfume, aftershave, or lotions (deodorant is allowed). Please arrive 15 minutes prior to your appointment time.    Follow-Up: At [facility], you and your health needs are our priority.  As part of our continuing mission to provide you with exceptional heart care, we have created designated Provider Care Teams.  These Care Teams include your primary Cardiologist (physician) and Advanced Practice Providers (APPs -  Physician Assistants and Nurse Practitioners) who all work together to provide you with the care you need, when you need it.  We  recommend signing up for the patient portal called "MyChart".  Sign up information is provided on this After Visit Summary.  MyChart is used to connect with patients for Virtual Visits (Telemedicine).  Patients are able to view lab/test results, encounter notes, upcoming appointments, etc.  Non-urgent messages can be sent to your provider as well.   To learn more about what you can do with MyChart, go to [email or web address].    Your next appointment:   1 year(s)  The format for your next appointment:   In Person  Provider:   Gypsy Balsam, MD    Other Instructions   Important Information About Sugar

## 2022-01-06 NOTE — Addendum Note (Signed)
Addended by: Roosvelt Harps R on: 01/06/2022 09:16 AM   Modules accepted: Orders

## 2022-01-20 ENCOUNTER — Ambulatory Visit: Payer: BC Managed Care – PPO | Attending: Cardiology

## 2022-01-20 DIAGNOSIS — I10 Essential (primary) hypertension: Secondary | ICD-10-CM

## 2022-01-20 DIAGNOSIS — E782 Mixed hyperlipidemia: Secondary | ICD-10-CM

## 2022-01-20 DIAGNOSIS — I2510 Atherosclerotic heart disease of native coronary artery without angina pectoris: Secondary | ICD-10-CM | POA: Diagnosis not present

## 2022-01-20 DIAGNOSIS — I77810 Thoracic aortic ectasia: Secondary | ICD-10-CM

## 2022-01-21 DIAGNOSIS — Z6836 Body mass index (BMI) 36.0-36.9, adult: Secondary | ICD-10-CM | POA: Diagnosis not present

## 2022-01-21 DIAGNOSIS — F329 Major depressive disorder, single episode, unspecified: Secondary | ICD-10-CM | POA: Diagnosis not present

## 2022-01-21 DIAGNOSIS — Z1331 Encounter for screening for depression: Secondary | ICD-10-CM | POA: Diagnosis not present

## 2022-01-21 LAB — ECHOCARDIOGRAM COMPLETE
Area-P 1/2: 4.06 cm2
P 1/2 time: 426 msec
S' Lateral: 2.6 cm

## 2022-01-26 ENCOUNTER — Telehealth: Payer: Self-pay

## 2022-01-26 NOTE — Telephone Encounter (Signed)
LVM and My Chart Message per DPR- per Dr. Krasowski's note regarding normal lab results. Encouraged to call with any questions or concerns. Routed to PCP.  

## 2022-02-20 ENCOUNTER — Other Ambulatory Visit: Payer: Self-pay | Admitting: Cardiology

## 2022-02-21 NOTE — Telephone Encounter (Signed)
Losartan 50 mg # 135 tablets x 3 refills [facility] to [facility] [address] Tia Alert

## 2022-02-22 ENCOUNTER — Encounter: Payer: Self-pay | Admitting: Cardiology

## 2022-02-22 ENCOUNTER — Telehealth: Payer: Self-pay | Admitting: Cardiology

## 2022-02-22 ENCOUNTER — Other Ambulatory Visit: Payer: Self-pay

## 2022-02-22 MED ORDER — LOSARTAN POTASSIUM 50 MG PO TABS: ORAL_TABLET | ORAL | 3 refills | Status: DC

## 2022-02-22 NOTE — Telephone Encounter (Signed)
Error

## 2022-02-22 NOTE — Telephone Encounter (Signed)
*  STAT* If patient is at the pharmacy, call can be transferred to refill team.   1. Which medications need to be refilled? (please list name of each medication and dose if known)  losartan (COZAAR) 50 MG tablet  2. Which pharmacy/location (including street and city if local pharmacy) is medication to be sent to? [id number] [place], [place] [address]    3. Do they need a 30 day or 90 day supply?  90 day supply

## 2022-02-22 NOTE — Telephone Encounter (Signed)
Ref Cozaar 50mg  1 in the AM 1/2 in the PM #90 ref X 3

## 2022-03-08 DIAGNOSIS — F329 Major depressive disorder, single episode, unspecified: Secondary | ICD-10-CM | POA: Diagnosis not present

## 2022-03-08 DIAGNOSIS — Z6837 Body mass index (BMI) 37.0-37.9, adult: Secondary | ICD-10-CM | POA: Diagnosis not present

## 2022-03-23 ENCOUNTER — Encounter: Payer: Self-pay | Admitting: Cardiology

## 2022-03-23 NOTE — Telephone Encounter (Signed)
error 

## 2022-05-23 DIAGNOSIS — K5100 Ulcerative (chronic) pancolitis without complications: Secondary | ICD-10-CM | POA: Diagnosis not present

## 2022-05-26 DIAGNOSIS — K5150 Left sided colitis without complications: Secondary | ICD-10-CM | POA: Diagnosis not present

## 2022-09-23 DIAGNOSIS — Z1339 Encounter for screening examination for other mental health and behavioral disorders: Secondary | ICD-10-CM | POA: Diagnosis not present

## 2022-09-23 DIAGNOSIS — Z125 Encounter for screening for malignant neoplasm of prostate: Secondary | ICD-10-CM | POA: Diagnosis not present

## 2022-09-23 DIAGNOSIS — Z6833 Body mass index (BMI) 33.0-33.9, adult: Secondary | ICD-10-CM | POA: Diagnosis not present

## 2022-09-23 DIAGNOSIS — F329 Major depressive disorder, single episode, unspecified: Secondary | ICD-10-CM | POA: Diagnosis not present

## 2022-09-23 DIAGNOSIS — E7800 Pure hypercholesterolemia, unspecified: Secondary | ICD-10-CM | POA: Diagnosis not present

## 2022-09-23 DIAGNOSIS — Z1331 Encounter for screening for depression: Secondary | ICD-10-CM | POA: Diagnosis not present

## 2022-09-23 LAB — LAB REPORT - SCANNED: EGFR: 60

## 2022-12-22 ENCOUNTER — Other Ambulatory Visit: Payer: Self-pay | Admitting: Cardiology

## 2023-01-18 ENCOUNTER — Other Ambulatory Visit: Payer: Self-pay | Admitting: Cardiology

## 2023-01-22 ENCOUNTER — Other Ambulatory Visit: Payer: Self-pay | Admitting: Cardiology

## 2023-02-13 ENCOUNTER — Other Ambulatory Visit: Payer: Self-pay | Admitting: Cardiology

## 2023-02-18 ENCOUNTER — Other Ambulatory Visit: Payer: Self-pay | Admitting: Cardiology

## 2023-02-26 ENCOUNTER — Other Ambulatory Visit: Payer: Self-pay | Admitting: Cardiology

## 2023-03-02 ENCOUNTER — Other Ambulatory Visit: Payer: Self-pay | Admitting: Cardiology

## 2023-03-12 ENCOUNTER — Other Ambulatory Visit: Payer: Self-pay | Admitting: Cardiology

## 2023-03-13 NOTE — Telephone Encounter (Signed)
 Prescription sent to pharmacy.

## 2023-03-17 ENCOUNTER — Other Ambulatory Visit: Payer: Self-pay

## 2023-03-17 ENCOUNTER — Telehealth: Payer: Self-pay | Admitting: Cardiology

## 2023-03-17 MED ORDER — ROSUVASTATIN CALCIUM 5 MG PO TABS: 5.0000 mg | ORAL_TABLET | Freq: Every day | ORAL | 0 refills | Status: DC

## 2023-03-17 MED ORDER — LOSARTAN POTASSIUM 50 MG PO TABS: ORAL_TABLET | ORAL | 0 refills | Status: DC

## 2023-03-17 MED ORDER — AMLODIPINE BESYLATE 5 MG PO TABS: 5.0000 mg | ORAL_TABLET | Freq: Every day | ORAL | 0 refills | Status: DC

## 2023-03-17 NOTE — Telephone Encounter (Signed)
*  STAT* If patient is at the pharmacy, call can be transferred to refill team.   1. Which medications need to be refilled? (please list name of each medication and dose if known)   amLODipine (NORVASC) 5 MG tablet    losartan (COZAAR) 50 MG tablet    rosuvastatin (CRESTOR) 5 MG tablet    2. Which pharmacy/location (including street and city if local pharmacy) is medication to be sent to?  [id number] [place], [place] - [address]    3. Do they need a 30 day or 90 day supply? 30  Patient is out of this medication.

## 2023-04-12 ENCOUNTER — Other Ambulatory Visit: Payer: Self-pay | Admitting: Cardiology

## 2023-04-15 ENCOUNTER — Other Ambulatory Visit: Payer: Self-pay | Admitting: Cardiology

## 2023-05-09 ENCOUNTER — Ambulatory Visit: Payer: BC Managed Care – PPO | Attending: Cardiology | Admitting: Cardiology

## 2023-05-09 VITALS — BP 126/78 | HR 91 | Ht 65.0 in | Wt 200.6 lb

## 2023-05-09 DIAGNOSIS — I77810 Thoracic aortic ectasia: Secondary | ICD-10-CM | POA: Diagnosis not present

## 2023-05-09 DIAGNOSIS — I10 Essential (primary) hypertension: Secondary | ICD-10-CM | POA: Diagnosis not present

## 2023-05-09 DIAGNOSIS — I2510 Atherosclerotic heart disease of native coronary artery without angina pectoris: Secondary | ICD-10-CM

## 2023-05-09 DIAGNOSIS — E782 Mixed hyperlipidemia: Secondary | ICD-10-CM | POA: Diagnosis not present

## 2023-05-09 DIAGNOSIS — I493 Ventricular premature depolarization: Secondary | ICD-10-CM

## 2023-05-09 NOTE — Patient Instructions (Signed)
 Medication Instructions:  Your physician recommends that you continue on your current medications as directed. Please refer to the Current Medication list given to you today.  *If you need a refill on your cardiac medications before your next appointment, please call your pharmacy*  Lab Work: Your physician recommends that you return for lab work in: Today for Fasting Lipid Panel, AST & ALT  If you have labs (blood work) drawn today and your tests are completely normal, you will receive your results only by: MyChart Message (if you have MyChart) OR A paper copy in the mail If you have any lab test that is abnormal or we need to change your treatment, we will call you to review the results.  Testing/Procedures: NONE  Follow-Up: At [facility], you and your health needs are our priority.  As part of our continuing mission to provide you with exceptional heart care, our providers are all part of one team.  This team includes your primary Cardiologist (physician) and Advanced Practice Providers or APPs (Physician Assistants and Nurse Practitioners) who all work together to provide you with the care you need, when you need it.  Your next appointment:   1 year(s)  Provider:   Ralene Burger, MD    We recommend signing up for the patient portal called "MyChart".  Sign up information is provided on this After Visit Summary.  MyChart is used to connect with patients for Virtual Visits (Telemedicine).  Patients are able to view lab/test results, encounter notes, upcoming appointments, etc.  Non-urgent messages can be sent to your provider as well.   To learn more about what you can do with MyChart, go to [email or web address].   Other Instructions

## 2023-05-09 NOTE — Progress Notes (Signed)
 Cardiology Office Note:    Date:  05/09/2023   ID:  Ricardo Odom, DOB 1975/06/23, MRN [id number]  PCP:  Gaither Juba, MD  Cardiologist:  Ralene Burger, MD    Referring MD: Gaither Juba, MD   Chief Complaint  Patient presents with   Follow-up    History of Present Illness:    Ricardo Odom is a 48 y.o. 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 Comes today to months for follow-up overall doing great.  Asymptomatic, denies have any chest pain tightness squeezing pressure burning chest no palpitations dizziness swelling of lower extremities still work hard to have no difficulty doing it  Past Medical History:  Diagnosis Date   Aortic root dilatation (HCC) 06/11/2017   Essential hypertension 05/12/2017   Hyperlipidemia 06/11/2017   Hypertension    Lightheadedness 05/12/2017   Obesity (BMI 30-39.9) 10/16/2019   Palpitations 05/12/2017   Postoperative examination 04/13/2015   Precordial pain 10/16/2019   PVC's (premature ventricular contractions) 05/12/2017    Past Surgical History:  Procedure Laterality Date   APPENDECTOMY     THROAT SURGERY      Current Medications: Current Meds  Medication Sig   amLODipine (NORVASC) 5 MG tablet Take 1 tablet (5 mg total) by mouth daily.   losartan (COZAAR) 50 MG tablet Take 1 tablet (50 mg total) by mouth every morning AND 0.5 tablets (25 mg total) every evening. Patient must keep appointment for 05/09/23 for further refills..   mesalamine (LIALDA) 1.2 g EC tablet Take 1.2 mg by mouth 4 (four) times daily.   rosuvastatin (CRESTOR) 5 MG tablet Take 1 tablet (5 mg total) by mouth daily.   venlafaxine XR (EFFEXOR-XR) 37.5 MG 24 hr capsule Take 37.5 mg by mouth daily.   [DISCONTINUED]  vortioxetine HBr (TRINTELLIX) 10 MG TABS tablet Take 10 mg by mouth daily.     Allergies:   Patient has no known allergies.   Social History   Socioeconomic History   Marital status: Divorced    Spouse name: Not on file   Number of children: Not on file   Years of education: Not on file   Highest education level: Not on file  Occupational History   Not on file  Tobacco Use   Smoking status: Former    Current packs/day: 0.00    Average packs/day: 10.0 packs/day for 1 year (10.0 ttl pk-yrs)    Types: Cigarettes    Start date: 07/27/2001    Quit date: 07/28/2002    Years since quitting: 20.7   Smokeless tobacco: Current    Types: Snuff  Substance and Sexual Activity   Alcohol use: Yes    Alcohol/week: 25.0 standard drinks of alcohol    Types: 25 Cans of beer per week    Comment: daily   Drug use: No   Sexual activity: Not on file  Other Topics Concern   Not on file  Social History Narrative   Not on file   Social Drivers of Health   Financial Resource Strain: Not on file  Food Insecurity: Not on file  Transportation Needs: Not on file  Physical Activity: Not on file  Stress: Not on file  Social Connections: Not on file     Family  History: The patient's family history includes Bradycardia in his father; Healthy in his mother and sister; Heart disease in his paternal grandfather; Other in his father. ROS:   Please see the history of present illness.    All 14 point review of systems negative except as described per history of present illness  EKGs/Labs/Other Studies Reviewed:    EKG Interpretation Date/Time:  Tuesday May 09 2023 08:05:17 EDT Ventricular Rate:  91 PR Interval:  146 QRS Duration:  90 QT Interval:  346 QTC Calculation: 425 R Axis:   43  Text Interpretation: Normal sinus rhythm Normal ECG No previous ECGs available Confirmed by Ralene Burger [phone]) on 05/09/2023 8:07:10 AM    Recent Labs: No results found for requested labs within last 365  days.  Recent Lipid Panel    Component Value Date/Time   CHOL 199 10/16/2019 0912   TRIG 121 10/16/2019 0912   HDL 54 10/16/2019 0912   CHOLHDL 3.7 10/16/2019 0912   LDLCALC 123 (H) 10/16/2019 0912    Physical Exam:    VS:  BP 126/78 (BP Location: Right Arm, Patient Position: Sitting)   Pulse 91   Ht 5\' 5"  (1.651 m)   Wt 200 lb 9.6 oz (91 kg)   SpO2 97%   BMI 33.38 kg/m     Wt Readings from Last 3 Encounters:  05/09/23 200 lb 9.6 oz (91 kg)  01/06/22 222 lb (100.7 kg)  03/05/20 208 lb (94.3 kg)     GEN:  Well nourished, well developed in no acute distress HEENT: Normal NECK: No JVD; No carotid bruits LYMPHATICS: No lymphadenopathy CARDIAC: RRR, no murmurs, no rubs, no gallops RESPIRATORY:  Clear to auscultation without rales, wheezing or rhonchi  ABDOMEN: Soft, non-tender, non-distended MUSCULOSKELETAL:  No edema; No deformity  SKIN: Warm and dry LOWER EXTREMITIES: no swelling NEUROLOGIC:  Alert and oriented x 3 PSYCHIATRIC:  Normal affect   ASSESSMENT:    1. Essential hypertension   2. Mixed hyperlipidemia   3. Aortic root dilatation (HCC)   4. Minimal CAD   5. PVC's (premature ventricular contractions)    PLAN:    In order of problems listed above:  Coronary disease only minimal, antiplatelet therapy continue risk factors modifications asymptomatic. Mixed dyslipidemia I did review K PN which show me total cholesterol 167 HDL 78 this is from summer of last year we will recheck his fasting lipid profile today. Aortic root dilatation stable next year we will repeat echocardiogram. PVCs.  Stable. Essential hypertension blood pressure well-controlled   Medication Adjustments/Labs and Tests Ordered: Current medicines are reviewed at length with the patient today.  Concerns regarding medicines are outlined above.  Orders Placed This Encounter  Procedures   Lipid panel   AST   ALT   EKG 12-Lead   Medication changes: No orders of the defined types were  placed in this encounter.   Signed, Manfred Seed, MD, [facility] 05/09/2023 8:20 AM    [facility]

## 2023-05-10 LAB — LIPID PANEL
Chol/HDL Ratio: 2.4 ratio (ref 0.0–5.0)
Cholesterol, Total: 176 mg/dL (ref 100–199)
HDL: 72 mg/dL (ref >39)
LDL Chol Calc (NIH): 86 mg/dL (ref 0–99)
Triglycerides: 98 mg/dL (ref 0–149)
VLDL Cholesterol Cal: 18 mg/dL (ref 5–40)

## 2023-05-10 LAB — AST: AST: 27 IU/L (ref 0–40)

## 2023-05-10 LAB — ALT: ALT: 25 IU/L (ref 0–44)

## 2023-05-11 DIAGNOSIS — K5190 Ulcerative colitis, unspecified, without complications: Secondary | ICD-10-CM | POA: Diagnosis not present

## 2023-05-17 ENCOUNTER — Other Ambulatory Visit: Payer: Self-pay

## 2023-05-17 ENCOUNTER — Other Ambulatory Visit: Payer: Self-pay | Admitting: Cardiology

## 2023-05-17 DIAGNOSIS — E782 Mixed hyperlipidemia: Secondary | ICD-10-CM

## 2023-05-17 MED ORDER — ROSUVASTATIN CALCIUM 10 MG PO TABS: 10.0000 mg | ORAL_TABLET | Freq: Every day | ORAL | 3 refills | Status: AC

## 2023-05-22 DIAGNOSIS — K5190 Ulcerative colitis, unspecified, without complications: Secondary | ICD-10-CM | POA: Diagnosis not present

## 2023-06-15 ENCOUNTER — Encounter: Payer: Self-pay | Admitting: Cardiology

## 2023-06-15 ENCOUNTER — Other Ambulatory Visit: Payer: Self-pay | Admitting: Cardiology

## 2023-06-15 NOTE — Telephone Encounter (Signed)
 error

## 2023-07-07 DIAGNOSIS — E782 Mixed hyperlipidemia: Secondary | ICD-10-CM | POA: Diagnosis not present

## 2023-07-08 LAB — HEPATIC FUNCTION PANEL
ALT: 22 IU/L (ref 0–44)
AST: 27 IU/L (ref 0–40)
Albumin: 4.5 g/dL (ref 4.1–5.1)
Alkaline Phosphatase: 80 IU/L (ref 44–121)
Bilirubin Total: 0.3 mg/dL (ref 0.0–1.2)
Bilirubin, Direct: 0.14 mg/dL (ref 0.00–0.40)
Total Protein: 7.1 g/dL (ref 6.0–8.5)

## 2023-07-08 LAB — LIPID PANEL
Chol/HDL Ratio: 2.6 ratio (ref 0.0–5.0)
Cholesterol, Total: 149 mg/dL (ref 100–199)
HDL: 58 mg/dL (ref >39)
LDL Chol Calc (NIH): 74 mg/dL (ref 0–99)
Triglycerides: 91 mg/dL (ref 0–149)
VLDL Cholesterol Cal: 17 mg/dL (ref 5–40)

## 2023-07-10 ENCOUNTER — Ambulatory Visit: Payer: Self-pay | Admitting: Cardiology

## 2023-07-11 ENCOUNTER — Telehealth: Payer: Self-pay

## 2023-07-11 NOTE — Telephone Encounter (Signed)
 Left message on My Chart with lab results per Dr. Vanetta Shawl note. Routed to PCP.

## 2023-07-12 NOTE — Progress Notes (Signed)
 Left message for the patient to call back.

## 2023-07-13 ENCOUNTER — Telehealth: Payer: Self-pay

## 2023-07-13 NOTE — Telephone Encounter (Signed)
 Lab Results reviewed with pt as per Dr. Vanetta Shawl note.  Pt verbalized understanding and had no additional questions. Routed to PCP

## 2023-10-12 DIAGNOSIS — Z6833 Body mass index (BMI) 33.0-33.9, adult: Secondary | ICD-10-CM | POA: Diagnosis not present

## 2023-10-12 DIAGNOSIS — Z1339 Encounter for screening examination for other mental health and behavioral disorders: Secondary | ICD-10-CM | POA: Diagnosis not present

## 2023-10-12 DIAGNOSIS — Z1331 Encounter for screening for depression: Secondary | ICD-10-CM | POA: Diagnosis not present

## 2023-10-12 DIAGNOSIS — F329 Major depressive disorder, single episode, unspecified: Secondary | ICD-10-CM | POA: Diagnosis not present

## 2023-11-20 ENCOUNTER — Other Ambulatory Visit: Payer: Self-pay | Admitting: Cardiology
